# Patient Record
Sex: Male | Born: 1965
Health system: Southern US, Community
[De-identification: ages and names within clinical notes are randomized; demographics above are authoritative.]

## PROBLEM LIST (undated history)

## (undated) DIAGNOSIS — F32A Depression, unspecified: Secondary | ICD-10-CM

## (undated) DIAGNOSIS — I10 Essential (primary) hypertension: Secondary | ICD-10-CM

## (undated) DIAGNOSIS — F419 Anxiety disorder, unspecified: Secondary | ICD-10-CM

## (undated) DIAGNOSIS — G4730 Sleep apnea, unspecified: Secondary | ICD-10-CM

## (undated) DIAGNOSIS — E785 Hyperlipidemia, unspecified: Secondary | ICD-10-CM

## (undated) HISTORY — PX: COLONOSCOPY: SHX174

## (undated) HISTORY — PX: NO PAST SURGERIES: SHX2092

## (undated) HISTORY — DX: Sleep apnea, unspecified: G47.30

## (undated) HISTORY — DX: Hyperlipidemia, unspecified: E78.5

---

## 2002-10-09 ENCOUNTER — Encounter: Payer: Self-pay | Admitting: Emergency Medicine

## 2002-10-09 ENCOUNTER — Emergency Department (HOSPITAL_COMMUNITY): Admission: EM | Admit: 2002-10-09 | Discharge: 2002-10-09 | Payer: Self-pay | Admitting: Emergency Medicine

## 2012-02-06 ENCOUNTER — Ambulatory Visit (HOSPITAL_COMMUNITY)
Admission: RE | Admit: 2012-02-06 | Discharge: 2012-02-06 | Disposition: A | Discharge status: Home / Self Care | Payer: BC Managed Care – PPO | Source: Ambulatory Visit | Attending: Obstetrics | Admitting: Obstetrics

## 2012-02-06 DIAGNOSIS — Z31448 Encounter for other genetic testing of male for procreative management: Secondary | ICD-10-CM | POA: Insufficient documentation

## 2012-02-06 LAB — CBC
HCT: 42.4 % (ref 39.0–52.0)
Hemoglobin: 14.3 g/dL (ref 13.0–17.0)
MCH: 27.6 pg (ref 26.0–34.0)
MCHC: 33.7 g/dL (ref 30.0–36.0)
MCV: 81.7 fL (ref 78.0–100.0)
Platelets: 255 K/uL (ref 150–400)
RBC: 5.19 MIL/uL (ref 4.22–5.81)
RDW: 13 % (ref 11.5–15.5)
WBC: 7.2 K/uL (ref 4.0–10.5)

## 2012-02-06 LAB — FERRITIN: Ferritin: 122 ng/mL (ref 22–322)

## 2012-02-12 LAB — HEMOGLOBINOPATHY EVALUATION
Hemoglobin Other: 0 %
Hgb A2 Quant: 2.7 % (ref 2.2–3.2)
Hgb A: 97.3 % (ref 96.8–97.8)
Hgb F Quant: 0 % (ref 0.0–2.0)
Hgb S Quant: 0 %

## 2012-02-20 ENCOUNTER — Telehealth (HOSPITAL_COMMUNITY): Payer: Self-pay | Admitting: MS"

## 2012-02-20 NOTE — Telephone Encounter (Signed)
Left message for patient's wife, Binnie Rail to return call.

## 2012-02-20 NOTE — Telephone Encounter (Signed)
Discussed with Mr. Munyon partner, Ms. Binnie Rail, that his hemoglobin electrophoresis, ferritin, and complete blood count studies were all within normal range, indicating normal hemoglobin (AA). Thus, he does not appear to have a beta globin variant, such as sickle cell trait or beta thalassemia trait.

## 2013-04-22 ENCOUNTER — Emergency Department (HOSPITAL_COMMUNITY): Payer: Self-pay

## 2013-04-22 ENCOUNTER — Encounter (HOSPITAL_COMMUNITY): Payer: Self-pay | Admitting: Adult Health

## 2013-04-22 DIAGNOSIS — K1120 Sialoadenitis, unspecified: Secondary | ICD-10-CM | POA: Insufficient documentation

## 2013-04-22 DIAGNOSIS — I10 Essential (primary) hypertension: Secondary | ICD-10-CM | POA: Insufficient documentation

## 2013-04-22 DIAGNOSIS — R51 Headache: Secondary | ICD-10-CM | POA: Insufficient documentation

## 2013-04-22 LAB — COMPREHENSIVE METABOLIC PANEL
ALT: 32 U/L (ref 0–53)
AST: 42 U/L — ABNORMAL HIGH (ref 0–37)
Alkaline Phosphatase: 82 U/L (ref 39–117)
CO2: 30 mEq/L (ref 19–32)
Calcium: 9.6 mg/dL (ref 8.4–10.5)
Chloride: 98 mEq/L (ref 96–112)
GFR calc Af Amer: >90 mL/min (ref >90)
GFR calc non Af Amer: 81 mL/min — ABNORMAL LOW (ref >90)
Glucose, Bld: 98 mg/dL (ref 70–99)
Potassium: 2.8 mEq/L — ABNORMAL LOW (ref 3.5–5.1)
Sodium: 137 mEq/L (ref 135–145)
Total Bilirubin: 0.5 mg/dL (ref 0.3–1.2)

## 2013-04-22 MED ORDER — OXYCODONE-ACETAMINOPHEN 5-325 MG PO TABS
1.0000 | ORAL_TABLET | Freq: Once | ORAL | Status: AC
  Administered 2013-04-22: 1 via ORAL
  Filled 2013-04-22: qty 1

## 2013-04-22 NOTE — ED Notes (Addendum)
Presents with right lower tooth pain that began one week ago, worse with eating and drinking. Pt is hypertensive, c/o headache that began at 20:45. Denies dizziness and chest pain.

## 2013-04-22 NOTE — ED Notes (Signed)
NURSE FIRST ROUNDS :  NURSE EXPLAINED DELAY AND PROCESS , UPDATED ON WAIT TIME , DENEIS PAIN AT THIS TIME , RESPIRATIONS UNLABORED , SITTING AT WAITING AREA WITH NO DISTRESS.

## 2013-04-23 ENCOUNTER — Emergency Department (HOSPITAL_COMMUNITY)
Admission: EM | Admit: 2013-04-23 | Discharge: 2013-04-23 | Disposition: A | Discharge status: Home / Self Care | Payer: Self-pay | Attending: Emergency Medicine | Admitting: Emergency Medicine

## 2013-04-23 DIAGNOSIS — I10 Essential (primary) hypertension: Secondary | ICD-10-CM

## 2013-04-23 DIAGNOSIS — K1120 Sialoadenitis, unspecified: Secondary | ICD-10-CM

## 2013-04-23 LAB — CBC WITH DIFFERENTIAL/PLATELET
Basophils Relative: 1 % (ref 0–1)
Eosinophils Relative: 1 % (ref 0–5)
Hemoglobin: 13.6 g/dL (ref 13.0–17.0)
MCH: 28.5 pg (ref 26.0–34.0)
Monocytes Absolute: 0.7 10*3/uL (ref 0.1–1.0)
Monocytes Relative: 7 % (ref 3–12)
Neutrophils Relative %: 53 % (ref 43–77)
RBC: 4.77 MIL/uL (ref 4.22–5.81)
WBC: 10.5 10*3/uL (ref 4.0–10.5)

## 2013-04-23 MED ORDER — POTASSIUM CHLORIDE CRYS ER 20 MEQ PO TBCR
40.0000 meq | EXTENDED_RELEASE_TABLET | Freq: Once | ORAL | Status: AC
  Administered 2013-04-23: 40 meq via ORAL
  Filled 2013-04-23: qty 2

## 2013-04-23 MED ORDER — OXYCODONE-ACETAMINOPHEN 5-325 MG PO TABS: 1.0000 | ORAL_TABLET | ORAL | Status: DC | PRN

## 2013-04-23 MED ORDER — IBUPROFEN 800 MG PO TABS
800.0000 mg | ORAL_TABLET | Freq: Once | ORAL | Status: AC
  Administered 2013-04-23: 800 mg via ORAL
  Filled 2013-04-23: qty 1

## 2013-04-23 MED ORDER — AMOXICILLIN-POT CLAVULANATE 875-125 MG PO TABS: 1.0000 | ORAL_TABLET | Freq: Two times a day (BID) | ORAL | Status: DC

## 2013-04-23 MED ORDER — HYDROCHLOROTHIAZIDE 25 MG PO TABS: 25.0000 mg | ORAL_TABLET | Freq: Every day | ORAL | Status: DC

## 2013-04-23 NOTE — ED Provider Notes (Signed)
History     CSN: [id number]  Arrival date & time 04/22/13  2043   First MD Initiated Contact with Patient 04/23/13 0111      Chief Complaint  Patient presents with  . Dental Pain  . Hypertension    (Consider location/radiation/quality/duration/timing/severity/associated sxs/prior treatment) HPI Comments: Eric Obrien is a 47 y.o. male who presents for evaluation of toothache. The toothache started one week ago. He does not have a dentist and has not had recent dental care. He denies fever, chills, nausea, vomiting, weakness, or dizziness. He also has a headache that started tonight. He denies paresthesias, weakness, gait disorder, syncope or near-syncope. The pain is worse with palpation of the right cheek. No other known modifying factors.  Patient is a 47 y.o. male presenting with tooth pain and hypertension. The history is provided by the patient.  Dental Pain Hypertension    History reviewed. No pertinent past medical history.  History reviewed. No pertinent past surgical history.  History reviewed. No pertinent family history.  History  Substance Use Topics  . Smoking status: Never Smoker   . Smokeless tobacco: Not on file  . Alcohol Use: No      Review of Systems  All other systems reviewed and are negative.    Allergies  Review of patient's allergies indicates no known allergies.  Home Medications   Current Outpatient Rx  Name  Route  Sig  Dispense  Refill  . acetaminophen (TYLENOL) 325 MG tablet   Oral   Take 650 mg by mouth every 6 (six) hours as needed for pain.         Marland Kitchen amoxicillin-clavulanate (AUGMENTIN) 875-125 MG per tablet   Oral   Take 1 tablet by mouth 2 (two) times daily. One po bid x 7 days   14 tablet   0   . hydrochlorothiazide (HYDRODIURIL) 25 MG tablet   Oral   Take 1 tablet (25 mg total) by mouth daily.   30 tablet   0   . oxyCODONE-acetaminophen (PERCOCET/ROXICET) 5-325 MG per tablet   Oral   Take 1 tablet by mouth every  4 (four) hours as needed for pain.   15 tablet   0     BP 182/124  Pulse 80  Temp(Src) 98.9 F (37.2 C) (Oral)  Resp 18  Ht 5\' 2"  (1.575 m)  Wt 172 lb 14.4 oz (78.427 kg)  BMI 31.62 kg/m2  SpO2 95%  Physical Exam  Nursing note and vitals reviewed. Constitutional: He is oriented to person, place, and time. He appears well-developed and well-nourished.  HENT:  Head: Normocephalic and atraumatic.  Right Ear: External ear normal.  Left Ear: External ear normal.  Tender right angle of jaw, to palpation; with associated induration and mild swelling of the right submandibular salivary gland; is no overlying erythema. No palpable salivary duct stone.  Eyes: Conjunctivae and EOM are normal. Pupils are equal, round, and reactive to light.  Neck: Normal range of motion and phonation normal. Neck supple. No tracheal deviation present. No thyromegaly present.  Cardiovascular: Normal rate, regular rhythm, normal heart sounds and intact distal pulses.   Pulmonary/Chest: Effort normal and breath sounds normal. He exhibits no bony tenderness.  Abdominal: Soft. Normal appearance. There is no tenderness.  Musculoskeletal: Normal range of motion.  Lymphadenopathy:    He has no cervical adenopathy.  Neurological: He is alert and oriented to person, place, and time. He has normal strength. No cranial nerve deficit or sensory deficit. He exhibits normal muscle  tone. Coordination normal.  Skin: Skin is warm, dry and intact.  Psychiatric: He has a normal mood and affect. His behavior is normal. Judgment and thought content normal.    ED Course  Procedures (including critical care time) Medications  oxyCODONE-acetaminophen (PERCOCET/ROXICET) 5-325 MG per tablet 1 tablet (1 tablet Oral Given 04/22/13 2119)  potassium chloride SA (K-DUR,KLOR-CON) CR tablet 40 mEq (40 mEq Oral Given 04/23/13 0124)  ibuprofen (ADVIL,MOTRIN) tablet 800 mg (800 mg Oral Given 04/23/13 0124)    Patient Vitals for the past 24  hrs:  BP Temp Temp src Pulse Resp SpO2 Height Weight  04/23/13 0200 182/124 mmHg - - 80 18 95 % - -  04/22/13 2305 171/120 mmHg 98.9 F (37.2 C) Oral 78 14 100 % - -  04/22/13 2227 169/116 mmHg 99.2 F (37.3 C) Oral 82 16 96 % - -  04/22/13 2048 190/126 mmHg 98.3 F (36.8 C) Oral 90 16 97 % 5\' 2"  (1.575 m) 172 lb 14.4 oz (78.427 kg)       Labs Reviewed  COMPREHENSIVE METABOLIC PANEL - Abnormal; Notable for the following:    Potassium 2.8 (*)    AST 42 (*)    GFR calc non Af Amer 81 (*)    All other components within normal limits  CBC WITH DIFFERENTIAL   Dg Chest 2 View  04/22/2013   *RADIOLOGY REPORT*  Clinical Data: Dental pain; hypertension.  Recent cough.  CHEST - 2 VIEW  Comparison: None.  Findings: The lungs are well-aerated.  Mild peribronchial thickening is noted.  There is no evidence of focal opacification, pleural effusion or pneumothorax.  The heart is normal in size; the mediastinal contour is within normal limits.  No acute osseous abnormalities are seen.  IMPRESSION: Mild peribronchial thickening noted; no acute cardiopulmonary process seen.   Original Report Authenticated By: Tonia Ghent, M.D.     1. Salivary gland infection   2. Hypertension       MDM  Salivary gland infection. No palpable salivary duct stone. Incidental hypertension. No evidence for hypertensive urgency, or and organ damage. Doubt metabolic instability, serious bacterial infection or impending vascular collapse; the patient is stable for discharge.  Nursing Notes Reviewed/ Care Coordinated, and agree without changes. Applicable Imaging Reviewed.  Interpretation of Laboratory Data incorporated into ED treatment   Plan: Home Medications- HCTZ, Augmentin, Percocet; Home Treatments- warm compress to right salivary gland, 3 times a day; Recommended follow up- ENT followup one week. PCP followup, one or 2 weeks, for ongoing management of hypertension.        Flint Melter, MD 04/23/13  [phone]

## 2013-10-15 ENCOUNTER — Emergency Department (HOSPITAL_COMMUNITY)
Admission: EM | Admit: 2013-10-15 | Discharge: 2013-10-15 | Disposition: A | Discharge status: Home / Self Care | Payer: No Typology Code available for payment source | Attending: Emergency Medicine | Admitting: Emergency Medicine

## 2013-10-15 ENCOUNTER — Emergency Department (HOSPITAL_COMMUNITY): Payer: No Typology Code available for payment source

## 2013-10-15 ENCOUNTER — Encounter (HOSPITAL_COMMUNITY): Payer: Self-pay | Admitting: Emergency Medicine

## 2013-10-15 DIAGNOSIS — R209 Unspecified disturbances of skin sensation: Secondary | ICD-10-CM | POA: Insufficient documentation

## 2013-10-15 DIAGNOSIS — S239XXA Sprain of unspecified parts of thorax, initial encounter: Secondary | ICD-10-CM | POA: Insufficient documentation

## 2013-10-15 DIAGNOSIS — R404 Transient alteration of awareness: Secondary | ICD-10-CM | POA: Insufficient documentation

## 2013-10-15 DIAGNOSIS — Y92410 Unspecified street and highway as the place of occurrence of the external cause: Secondary | ICD-10-CM | POA: Insufficient documentation

## 2013-10-15 DIAGNOSIS — Z79899 Other long term (current) drug therapy: Secondary | ICD-10-CM | POA: Insufficient documentation

## 2013-10-15 DIAGNOSIS — S0990XA Unspecified injury of head, initial encounter: Secondary | ICD-10-CM | POA: Insufficient documentation

## 2013-10-15 DIAGNOSIS — I10 Essential (primary) hypertension: Secondary | ICD-10-CM | POA: Insufficient documentation

## 2013-10-15 DIAGNOSIS — Y9389 Activity, other specified: Secondary | ICD-10-CM | POA: Insufficient documentation

## 2013-10-15 DIAGNOSIS — S29012A Strain of muscle and tendon of back wall of thorax, initial encounter: Secondary | ICD-10-CM

## 2013-10-15 DIAGNOSIS — M542 Cervicalgia: Secondary | ICD-10-CM | POA: Insufficient documentation

## 2013-10-15 HISTORY — DX: Essential (primary) hypertension: I10

## 2013-10-15 MED ORDER — IBUPROFEN 800 MG PO TABS: 800.0000 mg | ORAL_TABLET | Freq: Three times a day (TID) | ORAL | Status: DC

## 2013-10-15 MED ORDER — METHOCARBAMOL 500 MG PO TABS: 500.0000 mg | ORAL_TABLET | Freq: Two times a day (BID) | ORAL | Status: DC

## 2013-10-15 NOTE — Progress Notes (Signed)
Patient confirms his pcp is Dr. Melanee Left.  EDCM unable to locate this physician.
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
## 2013-10-18 NOTE — ED Provider Notes (Signed)
Medical screening examination/treatment/procedure(s) were performed by non-physician practitioner and as supervising physician I was immediately available for consultation/collaboration.   Gilda Crease, MD 10/18/13 [phone]

## 2015-02-07 ENCOUNTER — Ambulatory Visit: Payer: 59

## 2015-06-15 ENCOUNTER — Ambulatory Visit (INDEPENDENT_AMBULATORY_CARE_PROVIDER_SITE_OTHER): Payer: 59 | Admitting: Family Medicine

## 2015-06-15 VITALS — BP 152/92 | HR 82 | Temp 98.4°F | Resp 16 | Ht 64.0 in | Wt 199.0 lb

## 2015-06-15 DIAGNOSIS — B353 Tinea pedis: Secondary | ICD-10-CM | POA: Diagnosis not present

## 2015-06-15 DIAGNOSIS — R451 Restlessness and agitation: Secondary | ICD-10-CM

## 2015-06-15 DIAGNOSIS — I10 Essential (primary) hypertension: Secondary | ICD-10-CM | POA: Diagnosis not present

## 2015-06-15 DIAGNOSIS — L309 Dermatitis, unspecified: Secondary | ICD-10-CM | POA: Diagnosis not present

## 2015-06-15 DIAGNOSIS — L290 Pruritus ani: Secondary | ICD-10-CM

## 2015-06-15 LAB — LIPID PANEL
CHOL/HDL RATIO: 6.4 ratio
Cholesterol: 243 mg/dL — ABNORMAL HIGH (ref 0–200)
HDL: 38 mg/dL — AB (ref >=40)
LDL CALC: 165 mg/dL — AB (ref 0–99)
TRIGLYCERIDES: 198 mg/dL — AB (ref <150)
VLDL: 40 mg/dL (ref 0–40)

## 2015-06-15 LAB — COMPLETE METABOLIC PANEL WITH GFR
ALK PHOS: 68 U/L (ref 39–117)
ALT: 30 U/L (ref 0–53)
AST: 32 U/L (ref 0–37)
Albumin: 4.1 g/dL (ref 3.5–5.2)
BILIRUBIN TOTAL: 0.5 mg/dL (ref 0.2–1.2)
BUN: 12 mg/dL (ref 6–23)
CHLORIDE: 99 meq/L (ref 96–112)
CO2: 29 meq/L (ref 19–32)
CREATININE: 1.03 mg/dL (ref 0.50–1.35)
Calcium: 9.6 mg/dL (ref 8.4–10.5)
GFR, Est African American: >89 mL/min
GFR, Est Non African American: 85 mL/min
Glucose, Bld: 88 mg/dL (ref 70–99)
POTASSIUM: 3.7 meq/L (ref 3.5–5.3)
SODIUM: 138 meq/L (ref 135–145)
Total Protein: 7.6 g/dL (ref 6.0–8.3)

## 2015-06-15 LAB — POCT SKIN KOH: SKIN KOH, POC: POSITIVE

## 2015-06-15 MED ORDER — HYDROCORTISONE ACE-PRAMOXINE 2.5-1 % RE CREA: 1.0000 "application " | TOPICAL_CREAM | Freq: Three times a day (TID) | RECTAL | Status: DC

## 2015-06-15 MED ORDER — TERBINAFINE HCL 250 MG PO TABS: 250.0000 mg | ORAL_TABLET | Freq: Every day | ORAL | Status: DC

## 2015-06-15 MED ORDER — AMLODIPINE BESYLATE 5 MG PO TABS: 5.0000 mg | ORAL_TABLET | Freq: Every day | ORAL | Status: DC

## 2015-06-15 NOTE — Progress Notes (Signed)
  Subjective:  Patient ID: Eric Obrien, male    DOB: [birth year]  Age: 49 y.o. MRN: [id number]  49 year old man here for his first time. He is from [place]. He has several complaints. He has a history of high blood pressure, has not been taking his medicine and has been out of it for a number of months. He says he has restlessness a lot. 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 We talked about the need for more regular sleep. He has a rash between his fourth and fifth toes of his left foot. He pill something off of there. He has been having a very itchy anus and last week.   Objective:   No thyromegaly. Chest clear. Heart regular without murmurs. No edema. Dermatitis between fourth and fifth toes of the left foot, looks like a athlete's foot. I don't see anything that would've peeled off but it may have just been some crusting. The anal area looks a little irritated. No hemorrhoids.  Assessment & Plan:   Assessment: Pruritus ani Hypertension Restlessness Interdigital rash on foot   Plan: Skin scraping, see met, lipids  Results for orders placed or performed in visit on 06/15/15  POCT Skin KOH  Result Value Ref Range   Skin KOH, POC Positive     Patient Instructions  Take terbinafine 250 mg one daily for 2 weeks for infection of foot  Also get some over-the-counter Lotrimin (clotrimazole) cream and rub it between the toes twice daily  Use the Analpram cream around your anus (butt hole) twice daily as needed for itching  Take amlodipine 1 daily for your blood pressure  I will let you know the results of your labs  Return in about 3-4 months for a recheck on the blood pressure, sooner if problems     Phillp Dolores, MD 06/15/2015

## 2015-06-15 NOTE — Patient Instructions (Signed)
Take terbinafine 250 mg one daily for 2 weeks for infection of foot  Also get some over-the-counter Lotrimin (clotrimazole) cream and rub it between the toes twice daily  Use the Analpram cream around your anus (butt hole) twice daily as needed for itching  Take amlodipine 1 daily for your blood pressure  I will let you know the results of your labs  Return in about 3-4 months for a recheck on the blood pressure, sooner if problems

## 2015-06-20 ENCOUNTER — Telehealth: Payer: Self-pay

## 2015-06-20 ENCOUNTER — Telehealth: Payer: Self-pay | Admitting: Family Medicine

## 2015-06-20 DIAGNOSIS — E7800 Pure hypercholesterolemia, unspecified: Secondary | ICD-10-CM

## 2015-06-20 MED ORDER — PRAVASTATIN SODIUM 40 MG PO TABS: 40.0000 mg | ORAL_TABLET | Freq: Every day | ORAL | Status: DC

## 2015-06-20 NOTE — Telephone Encounter (Signed)
ERROR

## 2015-06-20 NOTE — Telephone Encounter (Signed)
Pt missed his CB from the lab. Please advise at [phone]

## 2015-06-21 NOTE — Telephone Encounter (Signed)
Attempted to call pt back. No answer; left VM.
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
## 2015-06-26 NOTE — Telephone Encounter (Signed)
lmom to call lab back

## 2015-06-27 ENCOUNTER — Encounter: Payer: Self-pay | Admitting: Family Medicine

## 2015-06-27 NOTE — Telephone Encounter (Signed)
Patient wants lab results mailed to him

## 2015-06-28 NOTE — Telephone Encounter (Signed)
I have printed results and put in envelope to mail.

## 2015-07-26 ENCOUNTER — Other Ambulatory Visit: Payer: Self-pay | Admitting: Family Medicine

## 2015-10-07 ENCOUNTER — Ambulatory Visit (INDEPENDENT_AMBULATORY_CARE_PROVIDER_SITE_OTHER): Payer: 59 | Admitting: Family Medicine

## 2015-10-07 ENCOUNTER — Ambulatory Visit (INDEPENDENT_AMBULATORY_CARE_PROVIDER_SITE_OTHER): Payer: 59

## 2015-10-07 VITALS — BP 140/98 | HR 70 | Temp 98.5°F | Resp 18 | Ht 64.0 in | Wt 200.0 lb

## 2015-10-07 DIAGNOSIS — L290 Pruritus ani: Secondary | ICD-10-CM | POA: Diagnosis not present

## 2015-10-07 DIAGNOSIS — I10 Essential (primary) hypertension: Secondary | ICD-10-CM

## 2015-10-07 DIAGNOSIS — R0789 Other chest pain: Secondary | ICD-10-CM

## 2015-10-07 DIAGNOSIS — R0602 Shortness of breath: Secondary | ICD-10-CM

## 2015-10-07 DIAGNOSIS — R079 Chest pain, unspecified: Secondary | ICD-10-CM

## 2015-10-07 DIAGNOSIS — R635 Abnormal weight gain: Secondary | ICD-10-CM | POA: Diagnosis not present

## 2015-10-07 DIAGNOSIS — E785 Hyperlipidemia, unspecified: Secondary | ICD-10-CM

## 2015-10-07 DIAGNOSIS — E7800 Pure hypercholesterolemia, unspecified: Secondary | ICD-10-CM | POA: Diagnosis not present

## 2015-10-07 LAB — POCT CBC
GRANULOCYTE PERCENT: 40.2 % (ref 37–80)
HEMATOCRIT: 42.4 % — AB (ref 43.5–53.7)
HEMOGLOBIN: 14.1 g/dL (ref 14.1–18.1)
Lymph, poc: 4.5 — AB (ref 0.6–3.4)
MCH, POC: 27.8 pg (ref 27–31.2)
MCHC: 33.3 g/dL (ref 31.8–35.4)
MCV: 83.5 fL (ref 80–97)
MID (cbc): 0.6 (ref 0–0.9)
MPV: 8.2 fL (ref 0–99.8)
PLATELET COUNT, POC: 238 10*3/uL (ref 142–424)
POC GRANULOCYTE: 3.4 (ref 2–6.9)
POC LYMPH PERCENT: 53.2 %L — AB (ref 10–50)
POC MID %: 6.6 %M (ref 0–12)
RBC: 5.07 M/uL (ref 4.69–6.13)
RDW, POC: 12.7 %
WBC: 8.5 10*3/uL (ref 4.6–10.2)

## 2015-10-07 LAB — GLUCOSE, POCT (MANUAL RESULT ENTRY): POC GLUCOSE: 78 mg/dL (ref 70–99)

## 2015-10-07 MED ORDER — HYDROCORTISONE ACE-PRAMOXINE 2.5-1 % RE CREA: TOPICAL_CREAM | RECTAL | Status: DC

## 2015-10-07 MED ORDER — AMLODIPINE BESYLATE 10 MG PO TABS: 10.0000 mg | ORAL_TABLET | Freq: Every day | ORAL | Status: DC

## 2015-10-07 MED ORDER — PRAVASTATIN SODIUM 40 MG PO TABS: 40.0000 mg | ORAL_TABLET | Freq: Every day | ORAL | Status: DC

## 2015-10-07 MED ORDER — HYDROCHLOROTHIAZIDE 25 MG PO TABS: ORAL_TABLET | ORAL | Status: DC
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
## 2015-10-07 NOTE — Patient Instructions (Signed)
Continue previous medications  Increase amlodipine to 10 mg daily. You can double up on the existing prescription of 5 mg pills until you get the new prescription if you wish.  Referral is being made to cardiology. If you do not hear from our office regarding that by Tuesday or Wednesday please call and speak to our referrals desk.  In the event of further chest pain go to the emergency room  Avoid exercising vigorously for the next few days, to let the chest wall pain subsided, then you can resume gradually your regular exercise regimen.  Return in 3-4 weeks to follow-up on your blood pressure  Eat less as discussed to try and start losing some weight.  DASH Eating Plan DASH stands for "Dietary Approaches to Stop Hypertension." The DASH eating plan is a healthy eating plan that has been shown to reduce high blood pressure (hypertension). Additional health benefits may include reducing the risk of type 2 diabetes mellitus, heart disease, and stroke. The DASH eating plan may also help with weight loss. WHAT DO I NEED TO KNOW ABOUT THE DASH EATING PLAN? For the DASH eating plan, you will follow these general guidelines:  Choose foods with a percent daily value for sodium of less than 5% (as listed on the food label).  Use salt-free seasonings or herbs instead of table salt or sea salt.  Check with your health care provider or pharmacist before using salt substitutes.  Eat lower-sodium products, often labeled as "lower sodium" or "no salt added."  Eat fresh foods.  Eat more vegetables, fruits, and low-fat dairy products.  Choose whole grains. Look for the word "whole" as the first word in the ingredient list.  Choose fish and skinless chicken or [place] more often than red meat. Limit fish, poultry, and meat to 6 oz (170 g) each day.  Limit sweets, desserts, sugars, and sugary drinks.  Choose heart-healthy fats.  Limit cheese to 1 oz (28 g) per day.  Eat more home-cooked food and  less restaurant, buffet, and fast food.  Limit fried foods.  Cook foods using methods other than frying.  Limit canned vegetables. If you do use them, rinse them well to decrease the sodium.  When eating at a restaurant, ask that your food be prepared with less salt, or no salt if possible. WHAT FOODS CAN I EAT? Seek help from a dietitian for individual calorie needs. Grains Whole grain or whole wheat bread. Brown rice. Whole grain or whole wheat pasta. Quinoa, bulgur, and whole grain cereals. Low-sodium cereals. Corn or whole wheat flour tortillas. Whole grain cornbread. Whole grain crackers. Low-sodium crackers. Vegetables Fresh or frozen vegetables (raw, steamed, roasted, or grilled). Low-sodium or reduced-sodium tomato and vegetable juices. Low-sodium or reduced-sodium tomato sauce and paste. Low-sodium or reduced-sodium canned vegetables.  Fruits All fresh, canned (in natural juice), or frozen fruits. Meat and Other Protein Products Ground beef (85% or leaner), grass-fed beef, or beef trimmed of fat. Skinless chicken or [place]. Ground chicken or [place]. Pork trimmed of fat. All fish and seafood. Eggs. Dried beans, peas, or lentils. Unsalted nuts and seeds. Unsalted canned beans. Dairy Low-fat dairy products, such as skim or 1% milk, 2% or reduced-fat cheeses, low-fat ricotta or cottage cheese, or plain low-fat yogurt. Low-sodium or reduced-sodium cheeses. Fats and Oils Tub margarines without trans fats. Light or reduced-fat mayonnaise and salad dressings (reduced sodium). Avocado. Safflower, olive, or canola oils. Natural peanut or almond butter. Other Unsalted popcorn and pretzels. The items listed above may  not be a complete list of recommended foods or beverages. Contact your dietitian for more options. WHAT FOODS ARE NOT RECOMMENDED? Grains White bread. White pasta. White rice. Refined cornbread. Bagels and croissants. Crackers that contain trans fat. Vegetables Creamed or  fried vegetables. Vegetables in a cheese sauce. Regular canned vegetables. Regular canned tomato sauce and paste. Regular tomato and vegetable juices. Fruits Dried fruits. Canned fruit in light or heavy syrup. Fruit juice. Meat and Other Protein Products Fatty cuts of meat. Ribs, chicken wings, bacon, sausage, bologna, salami, chitterlings, fatback, hot dogs, bratwurst, and packaged luncheon meats. Salted nuts and seeds. Canned beans with salt. Dairy Whole or 2% milk, cream, half-and-half, and cream cheese. Whole-fat or sweetened yogurt. Full-fat cheeses or blue cheese. Nondairy creamers and whipped toppings. Processed cheese, cheese spreads, or cheese curds. Condiments Onion and garlic salt, seasoned salt, table salt, and sea salt. Canned and packaged gravies. Worcestershire sauce. Tartar sauce. Barbecue sauce. Teriyaki sauce. Soy sauce, including reduced sodium. Steak sauce. Fish sauce. Oyster sauce. Cocktail sauce. Horseradish. Ketchup and mustard. Meat flavorings and tenderizers. Bouillon cubes. Hot sauce. Tabasco sauce. Marinades. Taco seasonings. Relishes. Fats and Oils Butter, stick margarine, lard, shortening, ghee, and bacon fat. Coconut, palm kernel, or palm oils. Regular salad dressings. Other Pickles and olives. Salted popcorn and pretzels. The items listed above may not be a complete list of foods and beverages to avoid. Contact your dietitian for more information. WHERE CAN I FIND MORE INFORMATION? National Heart, Lung, and Blood Institute: travelstabloid.com   This information is not intended to replace advice given to you by your health care provider. Make sure you discuss any questions you have with your health care provider.   Document Released: 11/02/2011 Document Revised: 12/04/2014 Document Reviewed: 09/17/2013 Elsevier Interactive Patient Education [employer].

## 2015-10-08 LAB — LIPID PANEL
CHOL/HDL RATIO: 6.1 ratio — AB (ref <=5.0)
Cholesterol: 224 mg/dL — ABNORMAL HIGH (ref 125–200)
HDL: 37 mg/dL — AB (ref >=40)
LDL Cholesterol: 145 mg/dL — ABNORMAL HIGH (ref <130)
TRIGLYCERIDES: 211 mg/dL — AB (ref <150)
VLDL: 42 mg/dL — ABNORMAL HIGH (ref <30)

## 2015-10-08 LAB — COMPREHENSIVE METABOLIC PANEL
ALK PHOS: 73 U/L (ref 40–115)
ALT: 27 U/L (ref 9–46)
AST: 28 U/L (ref 10–40)
Albumin: 4.3 g/dL (ref 3.6–5.1)
BILIRUBIN TOTAL: 0.6 mg/dL (ref 0.2–1.2)
BUN: 14 mg/dL (ref 7–25)
CALCIUM: 9.5 mg/dL (ref 8.6–10.3)
CO2: 26 mmol/L (ref 20–31)
Chloride: 100 mmol/L (ref 98–110)
Creat: 1.14 mg/dL (ref 0.60–1.35)
GLUCOSE: 85 mg/dL (ref 65–99)
POTASSIUM: 3.7 mmol/L (ref 3.5–5.3)
Sodium: 138 mmol/L (ref 135–146)
TOTAL PROTEIN: 7.3 g/dL (ref 6.1–8.1)

## 2015-10-22 ENCOUNTER — Other Ambulatory Visit: Payer: Self-pay | Admitting: *Deleted

## 2015-10-22 MED ORDER — PRAVASTATIN SODIUM 80 MG PO TABS: 80.0000 mg | ORAL_TABLET | Freq: Every day | ORAL | Status: DC

## 2015-11-04 ENCOUNTER — Encounter: Payer: Self-pay | Admitting: Cardiology

## 2015-11-04 DIAGNOSIS — I119 Hypertensive heart disease without heart failure: Secondary | ICD-10-CM | POA: Insufficient documentation

## 2015-11-04 DIAGNOSIS — E785 Hyperlipidemia, unspecified: Secondary | ICD-10-CM | POA: Insufficient documentation

## 2015-11-04 DIAGNOSIS — E669 Obesity, unspecified: Secondary | ICD-10-CM | POA: Insufficient documentation

## 2015-11-04 DIAGNOSIS — I10 Essential (primary) hypertension: Secondary | ICD-10-CM

## 2015-11-04 NOTE — Progress Notes (Signed)
Patient ID: Eric Obrien, male   DOB: 02-Jul-1966, 49 y.o.   MRN: [id number]   Eric Obrien    Date of visit:  11/04/2015 DOB:  01/31/1966    Age:  64 yrs. Medical record number:  [id number]     Account number:  [id number] Primary Care Provider: HOPPER,DAVID ____________________________ CURRENT DIAGNOSES  1. Chest pain  2. Hyperlipidemia  3. Essential hypertension  4. Obesity ____________________________ ALLERGIES  No Known Allergies ____________________________ MEDICATIONS  1. amlodipine 10 mg tablet, 1 p.o. daily  2. hydrochlorothiazide 25 mg tablet, 1 p.o. daily  3. pravastatin 40 mg tablet, 1 p.o. daily  4. Analpram-HC 2.5 %-1 % rectal cream, Take as directed  5. methocarbamol 500 mg tablet, BID  6. multivitamin tablet, 1 p.o. daily  7. Lamisil 250 mg tablet, 1 p.o. daily  8. ibuprofen 800 mg tablet, PRN ____________________________ CHIEF COMPLAINTS  Chest pain  worse with chest movement on the way home from gym ____________________________ HISTORY OF PRESENT ILLNESS This very nice 49 year old African American male is seen for evaluation of chest pain. The patient has a history of hypertension, obesity and hyperlipidemia. He previously was able to workout on a regular basis without symptoms. He presented to his primary doctor on 10 November with chest tightness and shortness of breath that lasted around 3 minutes. He was described as a throbbing pain in his substernal area and is not associated with sweating or shortness of breath. It may have been mildly pleuritic. He was driving home after having exercise and had to pull over for a while and then saw Dr. Jodi Mourning. He has not had any recurrent pain since that period he denies PND, orthopnea, syncope, palpitations or claudication. Blood pressure was somewhat elevated and his amlodipine was increased. The chest wall pain was treated with ibuprofen and has improved. I was asked to see him for evaluation. ____________________________ PAST  HISTORY  Past Medical Illnesses:  hypertension, hyperlipidemia, obesity;  Cardiovascular Illnesses:  no previous history of cardiac disease.;  Surgical Procedures:  no prior surgical procedures;  NYHA Classification:  I;  Canadian Angina Classification:  Class 0: Asymptomatic;  Cardiology Procedures-Invasive:  no history of prior cardiac procedures;  Cardiology Procedures-Noninvasive:  no previous non-invasive procedures;  LVEF not documented,   ____________________________ CARDIO-PULMONARY TEST DATES EKG Date:  11/04/2015;   ____________________________ FAMILY HISTORY Brother -- Brother alive and well Brother -- Brother alive and well Father -- Father dead, Alzheimers disease Mother -- Mother alive and well Sister -- Sister alive and well Sister -- Sister alive and well ____________________________ SOCIAL HISTORY Alcohol Use:  occasionally;  Smoking:  never smoked;  Diet:  regular diet;  Lifestyle:  married;  Exercise:  gym;  Occupation:  Orthoptist;  Residence:  lives with wife and children;   ____________________________ REVIEW OF SYSTEMS General:  obesity  Integumentary:no rashes or new skin lesions. Eyes: wears eye glasses/contact lenses Ears, Nose, Throat, Mouth:  denies any hearing loss, epistaxis, hoarseness or difficulty speaking. Respiratory: denies dyspnea, cough, wheezing or hemoptysis. Cardiovascular:  please review HPI Abdominal: denies dyspepsia, GI bleeding, constipation, or diarrhea Genitourinary-Male: no dysuria, urgency, frequency, or nocturia  Musculoskeletal:  denies arthritis, venous insufficiency, or muscle weakness Neurological:  denies headaches, stroke, or TIA Hematological/Immunologic:  denies any food allergies, bleeding disorders. ____________________________ PHYSICAL EXAMINATION VITAL SIGNS  Blood Pressure:  144/70 Sitting, Left arm, large cuff  , 128/84 Standing, Left arm and large cuff   Pulse:  84/min. Weight:  200.00 lbs. Height:  64"BMI:  34  Constitutional:  pleasant African Americian male in no acute distress, mildly obese Skin:  warm and dry to touch, no apparent skin lesions, or masses noted. Head:  normocephalic, normal hair pattern, no masses or tenderness Eyes:  EOMS Intact, PERRLA, C and S clear, Funduscopic exam not done. ENT:  ears, nose and throat reveal no gross abnormalities.  Dentition good. Neck:  supple, without massess. No JVD, thyromegaly or carotid bruits. Carotid upstroke normal. Chest:  normal symmetry, clear to auscultation. Cardiac:  regular rhythm, normal S1 and S2, No S3 or S4, no murmurs, gallops or rubs detected. Abdomen:  abdomen soft,non-tender, no masses, no hepatospenomegaly, or aneurysm noted Peripheral Pulses:  the femoral,dorsalis pedis, and posterior tibial pulses are full and equal bilaterally with no bruits auscultated. Extremities & Back:  no deformities, clubbing, cyanosis, erythema or edema observed. Normal muscle strength and tone. Neurological:  no gross motor or sensory deficits noted, affect appropriate, oriented x3. ____________________________ MOST RECENT LIPID PANEL 10/11/15  CHOL TOTL 224 mg/dl, LDL 145 NM, HDL 37 mg/dl, TRIGLYCER 211 mg/dl and CHOL/HDL 6.1 (Calc) ____________________________ IMPRESSIONS/PLAN  1. Isolated episode of chest pain that has now resolved 2. Hypertension borderline control 3. Hyperlipidemia under treatment 4. Obesity with need to lose weight  Recommendations:  Chest pain has some atypical features but has multiple risk factors. He has an abnormal EKG with right axis deviation. There were no ischemic abnormalities noted. I recommended that he have an echocardiogram because of the right axis deviation and also that he have a standard exercise treadmill test. We talked about the importance of weight loss. Thank you asking me to see him with you. ____________________________ TODAYS ORDERS  1. treadmill:  Regular [facility] At [facility]  2. 2D,  color flow, doppler: First Available  3. 12 Lead EKG: Today                  ____________________________ Cardiology Physician:  Kerry Hough MD [facility]

## 2015-11-26 ENCOUNTER — Encounter: Payer: Self-pay | Admitting: Cardiology

## 2015-11-26 ENCOUNTER — Encounter: Payer: Self-pay | Admitting: Family Medicine

## 2015-11-26 NOTE — Progress Notes (Signed)
Patient ID: Eric Obrien, male   DOB: [birth year], 49 y.o.   MRN: [id number]  [name], [name]    Date of visit:  11/26/2015 DOB:  11-26-1966    Age:  64 yrs. Medical record number:  [id number]     Account number:  [id number] Primary Care Provider: [place]  1. Hypertensive heart disease without heart failure  2. Chest pain  3. Hyperlipidemia  4. Obesity  TREADMILL  The patient exercised on the standard Bruce protocol a total of 7:52 minutes into Bruce stage 3 achieving a workload of 9 METS. The test was stopped due to dyspnea and fatigue. The patient had no chest pain suggestive of angina. Heart rate rose to 157 which was 91% of predicted maximum. Blood pressure response was normal.  12-lead EKG shows horizontal axis at rest. With exercise the patient achieved target heart rate and had no ST segment depression consistent with ischemia. No arrhythmias occurred. No arrhythmias were noted.     IMPRESSIONS:  1. Clinically and EKG negative for ischemia 2. Average exercise tolerance for age  RECOMMENDATIONS:  Has a negative adequate exercise tolerance test as no evidence of myocardial ischemia. Echocardiogram shows moderate concentric LVH with normal systolic function. He likely has hypertensive heart disease but has a low risk treadmill test. It is recommended that he exercise regularly, lose weight, and maintain good blood pressure control. I would be happy to see him in the future should the need arise. May be worthwhile to consider statin therapy of hyperlipidemia  MOST RECENT LIPID PANEL 10/11/15  CHOL TOTL 224 mg/dl, LDL 145 NM, HDL 37 mg/dl, TRIGLYCER 211 mg/dl and CHOL/HDL 6.1 (Calc)    Cardiology Physician:  Kerry Hough. MD [facility]

## 2016-04-21 DIAGNOSIS — H11002 Unspecified pterygium of left eye: Secondary | ICD-10-CM | POA: Diagnosis not present

## 2016-04-21 DIAGNOSIS — H04123 Dry eye syndrome of bilateral lacrimal glands: Secondary | ICD-10-CM | POA: Diagnosis not present

## 2016-04-21 DIAGNOSIS — H40013 Open angle with borderline findings, low risk, bilateral: Secondary | ICD-10-CM | POA: Diagnosis not present

## 2016-04-21 DIAGNOSIS — H10413 Chronic giant papillary conjunctivitis, bilateral: Secondary | ICD-10-CM | POA: Diagnosis not present

## 2016-10-09 ENCOUNTER — Other Ambulatory Visit: Payer: Self-pay | Admitting: Family Medicine

## 2016-10-09 DIAGNOSIS — I10 Essential (primary) hypertension: Secondary | ICD-10-CM

## 2016-10-13 ENCOUNTER — Other Ambulatory Visit: Payer: Self-pay | Admitting: Family Medicine

## 2016-10-24 DIAGNOSIS — H40013 Open angle with borderline findings, low risk, bilateral: Secondary | ICD-10-CM | POA: Diagnosis not present

## 2016-12-08 ENCOUNTER — Ambulatory Visit (INDEPENDENT_AMBULATORY_CARE_PROVIDER_SITE_OTHER): Payer: BLUE CROSS/BLUE SHIELD | Admitting: Emergency Medicine

## 2016-12-08 VITALS — BP 110/84 | HR 96 | Temp 98.0°F | Resp 16 | Ht 64.0 in | Wt 199.2 lb

## 2016-12-08 DIAGNOSIS — J209 Acute bronchitis, unspecified: Secondary | ICD-10-CM

## 2016-12-08 DIAGNOSIS — R05 Cough: Secondary | ICD-10-CM | POA: Diagnosis not present

## 2016-12-08 DIAGNOSIS — R059 Cough, unspecified: Secondary | ICD-10-CM

## 2016-12-08 DIAGNOSIS — R5383 Other fatigue: Secondary | ICD-10-CM | POA: Diagnosis not present

## 2016-12-08 MED ORDER — AZITHROMYCIN 250 MG PO TABS: ORAL_TABLET | ORAL | 0 refills | Status: DC

## 2016-12-08 MED ORDER — PROMETHAZINE-CODEINE 6.25-10 MG/5ML PO SYRP: 5.0000 mL | ORAL_SOLUTION | Freq: Four times a day (QID) | ORAL | 0 refills | Status: AC | PRN

## 2016-12-08 NOTE — Patient Instructions (Addendum)
     IF you received an x-ray today, you will receive an invoice from [facility]. Please contact [facility] at [phone] with questions or concerns regarding your invoice.   IF you received labwork today, you will receive an invoice from [facility]. Please contact [facility] at [phone] with questions or concerns regarding your invoice.   Our billing staff will not be able to assist you with questions regarding bills from these companies.  You will be contacted with the lab results as soon as they are available. The fastest way to get your results is to activate your My Chart account. Instructions are located on the last page of this paperwork. If you have not heard from us regarding the results in 2 weeks, please contact this office.      Acute Bronchitis, Adult Acute bronchitis is when air tubes (bronchi) in the lungs suddenly get swollen. The condition can make it hard to breathe. It can also cause these symptoms:  A cough.  Coughing up clear, yellow, or green mucus.  Wheezing.  Chest congestion.  Shortness of breath.  A fever.  Body aches.  Chills.  A sore throat.  Follow these instructions at home: Medicines  Take over-the-counter and prescription medicines only as told by your doctor.  If you were prescribed an antibiotic medicine, take it as told by your doctor. Do not stop taking the antibiotic even if you start to feel better. General instructions  Rest.  Drink enough fluids to keep your pee (urine) clear or pale yellow.  Avoid smoking and secondhand smoke. If you smoke and you need help quitting, ask your doctor. Quitting will help your lungs heal faster.  Use an inhaler, cool mist vaporizer, or humidifier as told by your doctor.  Keep all follow-up visits as told by your doctor. This is important. How is this prevented? To lower your risk of getting this condition again:  Wash your hands often with soap and water. If you cannot  use soap and water, use hand sanitizer.  Avoid contact with people who have cold symptoms.  Try not to touch your hands to your mouth, nose, or eyes.  Make sure to get the flu shot every year.  Contact a doctor if:  Your symptoms do not get better in 2 weeks. Get help right away if:  You cough up blood.  You have chest pain.  You have very bad shortness of breath.  You become dehydrated.  You faint (pass out) or keep feeling like you are going to pass out.  You keep throwing up (vomiting).  You have a very bad headache.  Your fever or chills gets worse. This information is not intended to replace advice given to you by your health care provider. Make sure you discuss any questions you have with your health care provider. Document Released: 05/01/2008 Document Revised: 06/21/2016 Document Reviewed: 05/03/2016 Elsevier Interactive Patient Education  2017 Elsevier Inc.  

## 2016-12-08 NOTE — Progress Notes (Signed)
Eric Obrien 51 y.o.   Chief Complaint  Patient presents with  . Cough  . Fatigue  . Chills  . Flu Vaccine    HISTORY OF PRESENT ILLNESS: This is a 51 y.o. male complaining of cough and congestion for several days.  Cough  This is a new problem. The current episode started 1 to 4 weeks ago. The problem has been gradually worsening. The problem occurs constantly. The cough is productive of sputum. Associated symptoms include chills, a fever, headaches, myalgias, nasal congestion and a sore throat. Pertinent negatives include no chest pain, eye redness, hemoptysis, rash, rhinorrhea, shortness of breath or wheezing. Nothing aggravates the symptoms. Risk factors: none. He has tried OTC cough suppressant for the symptoms. The treatment provided no relief. There is no history of asthma, bronchiectasis, COPD, emphysema or pneumonia.     Prior to Admission medications   Medication Sig Start Date End Date Taking? Authorizing Provider  methocarbamol (ROBAXIN) 500 MG tablet Take 1 tablet (500 mg total) by mouth 2 (two) times daily. 10/15/13  Yes Hannah Muthersbaugh, PA-C  Multiple Vitamin (MULTIVITAMIN WITH MINERALS) TABS tablet Take 1 tablet by mouth daily.   Yes Historical Provider, MD  pravastatin (PRAVACHOL) 80 MG tablet Take 1 tablet (80 mg total) by mouth daily. 10/22/15  Yes Posey Boyer, MD  amLODipine (NORVASC) 10 MG tablet TAKE 1 TABLET(10 MG) BY MOUTH DAILY Patient not taking: Reported on 12/08/2016 10/10/16   Chelle Jeffery, PA-C  hydrochlorothiazide (HYDRODIURIL) 25 MG tablet TAKE 1 TABLET BY MOUTH DAILY FOR BLOOD PRESSURE Patient not taking: Reported on 12/08/2016 10/16/16   Chelle Jeffery, PA-C  terbinafine (LAMISIL) 250 MG tablet Take 1 tablet (250 mg total) by mouth daily. Patient not taking: Reported on 12/08/2016 06/15/15   Posey Boyer, MD    No Known Allergies  Patient Active Problem List   Diagnosis Date Noted  . Hyperlipidemia 11/04/2015  . Obesity (BMI 30-39.9)  11/04/2015  . Hypertensive heart disease without CHF     Past Medical History:  Diagnosis Date  . Hypertension     No past surgical history on file.  Social History   Social History  . Marital status: Single    Spouse name: N/A  . Number of children: N/A  . Years of education: N/A   Occupational History  . Not on file.   Social History Main Topics  . Smoking status: Never Smoker  . Smokeless tobacco: Not on file  . Alcohol use No  . Drug use: No  . Sexual activity: Not on file   Other Topics Concern  . Not on file   Social History Narrative  . No narrative on file    No family history on file.   Review of Systems  Constitutional: Positive for chills and fever.  HENT: Positive for congestion, sinus pain and sore throat. Negative for nosebleeds and rhinorrhea.   Eyes: Negative for discharge and redness.  Respiratory: Positive for cough. Negative for hemoptysis, shortness of breath and wheezing.   Cardiovascular: Negative for chest pain.  Gastrointestinal: Negative for abdominal pain, diarrhea, nausea and vomiting.  Musculoskeletal: Positive for myalgias.  Skin: Negative for rash.  Neurological: Positive for headaches.  Endo/Heme/Allergies: Negative.   Psychiatric/Behavioral: Negative.   All other systems reviewed and are negative.  Vitals:   12/08/16 1118  BP: 110/84  Pulse: 96  Resp: 16  Temp: 98 F (36.7 C)     Physical Exam  Constitutional: He is oriented to person, place, and time.  He appears well-developed and well-nourished.  HENT:  Head: Normocephalic and atraumatic.  Right Ear: External ear normal.  Left Ear: External ear normal.  Nose: Nose normal.  Mouth/Throat: Oropharynx is clear and moist.  Eyes: Conjunctivae and EOM are normal. Pupils are equal, round, and reactive to light.  Neck: Normal range of motion. Neck supple.  Cardiovascular: Normal rate, regular rhythm, normal heart sounds and intact distal pulses.   Pulmonary/Chest:  Effort normal and breath sounds normal.  Abdominal: Soft. He exhibits no distension. There is no tenderness.  Musculoskeletal: Normal range of motion.  Neurological: He is alert and oriented to person, place, and time.  Skin: Skin is warm and dry. Capillary refill takes less than 2 seconds.  Psychiatric: He has a normal mood and affect. His behavior is normal.  Vitals reviewed.    ASSESSMENT & PLAN: [name] was seen today for cough, fatigue, chills and flu vaccine.  Diagnoses and all orders for this visit:  Acute bronchitis, unspecified organism  Cough  Fatigue, unspecified type  Other orders -     Cancel: Flu Vaccine QUAD 36+ mos IM -     azithromycin (ZITHROMAX) 250 MG tablet; Sig as indicated -     promethazine-codeine (PHENERGAN WITH CODEINE) 6.25-10 MG/5ML syrup; Take 5 mLs by mouth every 6 (six) hours as needed for cough.     Patient Instructions       IF you received an x-ray today, you will receive an invoice from [facility]. Please contact [facility] at [phone] with questions or concerns regarding your invoice.   IF you received labwork today, you will receive an invoice from [place] [facility]. Please contact [facility] at [phone] with questions or concerns regarding your invoice.   Our billing staff will not be able to assist you with questions regarding bills from these companies.  You will be contacted with the lab results as soon as they are available. The fastest way to get your results is to activate your My Chart account. Instructions are located on the last page of this paperwork. If you have not heard from [place] regarding the results in 2 weeks, please contact this office.     Acute Bronchitis, Adult Acute bronchitis is when air tubes (bronchi) in the lungs suddenly get swollen. The condition can make it hard to breathe. It can also cause these symptoms:  A cough.  Coughing up clear, yellow, or green mucus.  Wheezing.  Chest  congestion.  Shortness of breath.  A fever.  Body aches.  Chills.  A sore throat. Follow these instructions at home: Medicines  Take over-the-counter and prescription medicines only as told by your doctor.  If you were prescribed an antibiotic medicine, take it as told by your doctor. Do not stop taking the antibiotic even if you start to feel better. General instructions  Rest.  Drink enough fluids to keep your pee (urine) clear or pale yellow.  Avoid smoking and secondhand smoke. If you smoke and you need help quitting, ask your doctor. Quitting will help your lungs heal faster.  Use an inhaler, cool mist vaporizer, or humidifier as told by your doctor.  Keep all follow-up visits as told by your doctor. This is important. How is this prevented? To lower your risk of getting this condition again:  Wash your hands often with soap and water. If you cannot use soap and water, use hand sanitizer.  Avoid contact with people who have cold symptoms.  Try not to touch your hands to your  mouth, nose, or eyes.  Make sure to get the flu shot every year. Contact a doctor if:  Your symptoms do not get better in 2 weeks. Get help right away if:  You cough up blood.  You have chest pain.  You have very bad shortness of breath.  You become dehydrated.  You faint (pass out) or keep feeling like you are going to pass out.  You keep throwing up (vomiting).  You have a very bad headache.  Your fever or chills gets worse. This information is not intended to replace advice given to you by your health care provider. Make sure you discuss any questions you have with your health care provider. Document Released: 05/01/2008 Document Revised: 06/21/2016 Document Reviewed: 05/03/2016 Elsevier Interactive Patient Education  2017 Elsevier Inc.     Agustina Caroli, MD [facility]

## 2017-01-01 ENCOUNTER — Ambulatory Visit (INDEPENDENT_AMBULATORY_CARE_PROVIDER_SITE_OTHER): Payer: BLUE CROSS/BLUE SHIELD | Admitting: Urgent Care

## 2017-01-01 VITALS — BP 116/80 | HR 74 | Temp 98.5°F | Resp 16 | Ht 64.0 in | Wt 200.6 lb

## 2017-01-01 DIAGNOSIS — R21 Rash and other nonspecific skin eruption: Secondary | ICD-10-CM | POA: Diagnosis not present

## 2017-01-01 DIAGNOSIS — R05 Cough: Secondary | ICD-10-CM | POA: Diagnosis not present

## 2017-01-01 DIAGNOSIS — R0981 Nasal congestion: Secondary | ICD-10-CM

## 2017-01-01 DIAGNOSIS — R059 Cough, unspecified: Secondary | ICD-10-CM

## 2017-01-01 MED ORDER — BENZONATATE 100 MG PO CAPS: 100.0000 mg | ORAL_CAPSULE | Freq: Three times a day (TID) | ORAL | 0 refills | Status: DC | PRN

## 2017-01-01 MED ORDER — CETIRIZINE HCL 10 MG PO TABS: 10.0000 mg | ORAL_TABLET | Freq: Every day | ORAL | 11 refills | Status: DC

## 2017-01-01 MED ORDER — HYDROCOD POLST-CPM POLST ER 10-8 MG/5ML PO SUER: 5.0000 mL | Freq: Every evening | ORAL | 0 refills | Status: DC | PRN

## 2017-01-01 MED ORDER — METHYLPREDNISOLONE ACETATE 80 MG/ML IJ SUSP
80.0000 mg | Freq: Once | INTRAMUSCULAR | Status: AC
  Administered 2017-01-01: 80 mg via INTRAMUSCULAR

## 2017-01-01 MED ORDER — PSEUDOEPHEDRINE HCL ER 120 MG PO TB12: 120.0000 mg | ORAL_TABLET | Freq: Two times a day (BID) | ORAL | 3 refills | Status: DC

## 2017-01-01 NOTE — Patient Instructions (Addendum)
For your skin, use over-the-counter Cortizone (hydrocortisone) 1% twice daily for 1 week.   Cough, Adult Coughing is a reflex that clears your throat and your airways. Coughing helps to heal and protect your lungs. It is normal to cough occasionally, but a cough that happens with other symptoms or lasts a long time may be a sign of a condition that needs treatment. A cough may last only 2-3 weeks (acute), or it may last longer than 8 weeks (chronic). What are the causes? Coughing is commonly caused by:  Breathing in substances that irritate your lungs.  A viral or bacterial respiratory infection.  Allergies.  Asthma.  Postnasal drip.  Smoking.  Acid backing up from the stomach into the esophagus (gastroesophageal reflux).  Certain medicines.  Chronic lung problems, including COPD (or rarely, lung cancer).  Other medical conditions such as heart failure. Follow these instructions at home: Pay attention to any changes in your symptoms. Take these actions to help with your discomfort:  Take medicines only as told by your health care provider.  If you were prescribed an antibiotic medicine, take it as told by your health care provider. Do not stop taking the antibiotic even if you start to feel better.  Talk with your health care provider before you take a cough suppressant medicine.  Drink enough fluid to keep your urine clear or pale yellow.  If the air is dry, use a cold steam vaporizer or humidifier in your bedroom or your home to help loosen secretions.  Avoid anything that causes you to cough at work or at home.  If your cough is worse at night, try sleeping in a semi-upright position.  Avoid cigarette smoke. If you smoke, quit smoking. If you need help quitting, ask your health care provider.  Avoid caffeine.  Avoid alcohol.  Rest as needed. Contact a health care provider if:  You have new symptoms.  You cough up pus.  Your cough does not get better after  2-3 weeks, or your cough gets worse.  You cannot control your cough with suppressant medicines and you are losing sleep.  You develop pain that is getting worse or pain that is not controlled with pain medicines.  You have a fever.  You have unexplained weight loss.  You have night sweats. Get help right away if:  You cough up blood.  You have difficulty breathing.  Your heartbeat is very fast. This information is not intended to replace advice given to you by your health care provider. Make sure you discuss any questions you have with your health care provider. Document Released: 05/12/2011 Document Revised: 04/20/2016 Document Reviewed: 01/20/2015 Elsevier Interactive Patient Education  2017 [employer].     IF you received an x-ray today, you will receive an invoice from [facility]. Please contact [facility] at [phone] with questions or concerns regarding your invoice.   IF you received labwork today, you will receive an invoice from [place]. Please contact [facility] at [phone] with questions or concerns regarding your invoice.   Our billing staff will not be able to assist you with questions regarding bills from these companies.  You will be contacted with the lab results as soon as they are available. The fastest way to get your results is to activate your My Chart account. Instructions are located on the last page of this paperwork. If you have not heard from [place] regarding the results in 2 weeks, please contact this office.

## 2017-01-01 NOTE — Progress Notes (Signed)
  MRN: [id number] DOB: 1966/01/01  Subjective:   Demarian Gillihan is a 51 y.o. male presenting for chief complaint of Cough (nonproductive x 3 days) and Chills  Reports 3 day history of productive cough, mild shob, chills. Cough causes pain over flank sides. Also has nasal congestion. Patient was seen 12/08/2016 and started on azithromycin with significant improvement of his cough symptoms then. Has tried otc cough medications. Denies fever, chest pain, wheezing, sore throat, sinus pain, ear pain, n/v, abdominal pain, body aches. Did not have flu shot this season. Denies smoking cigarettes.  Jessey has a current medication list which includes the following prescription(s): amlodipine, hydrochlorothiazide, multivitamin with minerals, terbinafine, methocarbamol, and pravastatin. Also has No Known Allergies.  Jarrick  has a past medical history of Hypertension. Also  has no past surgical history on file.  Objective:   Vitals: BP 116/80 (BP Location: Right Arm, Patient Position: Sitting, Cuff Size: Large)   Pulse 74   Temp 98.5 F (36.9 C) (Oral)   Resp 16   Ht 5\' 4"  (1.626 m)   Wt 200 lb 9.6 oz (91 kg)   SpO2 97%   BMI 34.43 kg/m   Physical Exam  Constitutional: He is oriented to person, place, and time. He appears well-developed and well-nourished.  HENT:  TM's intact bilaterally, no effusions or erythema. Nasal turbinates boggy and edematous with thick white mucus, nasal passages minimally patent. No sinus tenderness. Oropharynx with mild post-nasal drainage, mucous membranes moist, dentition in good repair.  Eyes: Right eye exhibits no discharge. Left eye exhibits no discharge.  Neck: Normal range of motion. Neck supple.  Cardiovascular: Normal rate, regular rhythm and intact distal pulses.  Exam reveals no gallop and no friction rub.   No murmur heard. Pulmonary/Chest: No respiratory distress. He has no wheezes. He has no rales.  Lymphadenopathy:    He has no cervical adenopathy.    Neurological: He is alert and oriented to person, place, and time.  Skin: Skin is warm and dry. Rash (dry scaly patch over right lateral bicep) noted.   Assessment and Plan :   1. Cough 2. Nasal congestion - Patient's physical exam findings reassuring. I will use steroid injection to help stop cough and address possible rhinitis. He is to start Zyrtec, Sudafed. Use cough suppression medications otherwise.  3. Rash and nonspecific skin eruption - Patient reported dry itchy spot over his right bicep at the end of his visit. I recommended he use otc cortisone cream, rtc in 1 week if no improvement.   Jaynee Eagles, PA-C [facility] [phone] 01/01/2017  2:40 PM

## 2017-01-10 DIAGNOSIS — H01021 Squamous blepharitis right upper eyelid: Secondary | ICD-10-CM | POA: Diagnosis not present

## 2017-01-10 DIAGNOSIS — H04123 Dry eye syndrome of bilateral lacrimal glands: Secondary | ICD-10-CM | POA: Diagnosis not present

## 2017-01-10 DIAGNOSIS — H10413 Chronic giant papillary conjunctivitis, bilateral: Secondary | ICD-10-CM | POA: Diagnosis not present

## 2017-01-10 DIAGNOSIS — H40013 Open angle with borderline findings, low risk, bilateral: Secondary | ICD-10-CM | POA: Diagnosis not present

## 2017-01-29 ENCOUNTER — Other Ambulatory Visit: Payer: Self-pay | Admitting: Physician Assistant

## 2017-01-29 DIAGNOSIS — I10 Essential (primary) hypertension: Secondary | ICD-10-CM

## 2017-01-29 NOTE — Progress Notes (Signed)
Script destroyed per me. Written 12/08/16

## 2017-06-15 IMAGING — CR DG CHEST 2V
2 series · 2 of 2 positions shown · non-contrast
Comparison: April 22, 2013

CLINICAL DATA: Chest pain, shortness of breath since this morning

EXAM:
CHEST  2 VIEW

[PA]
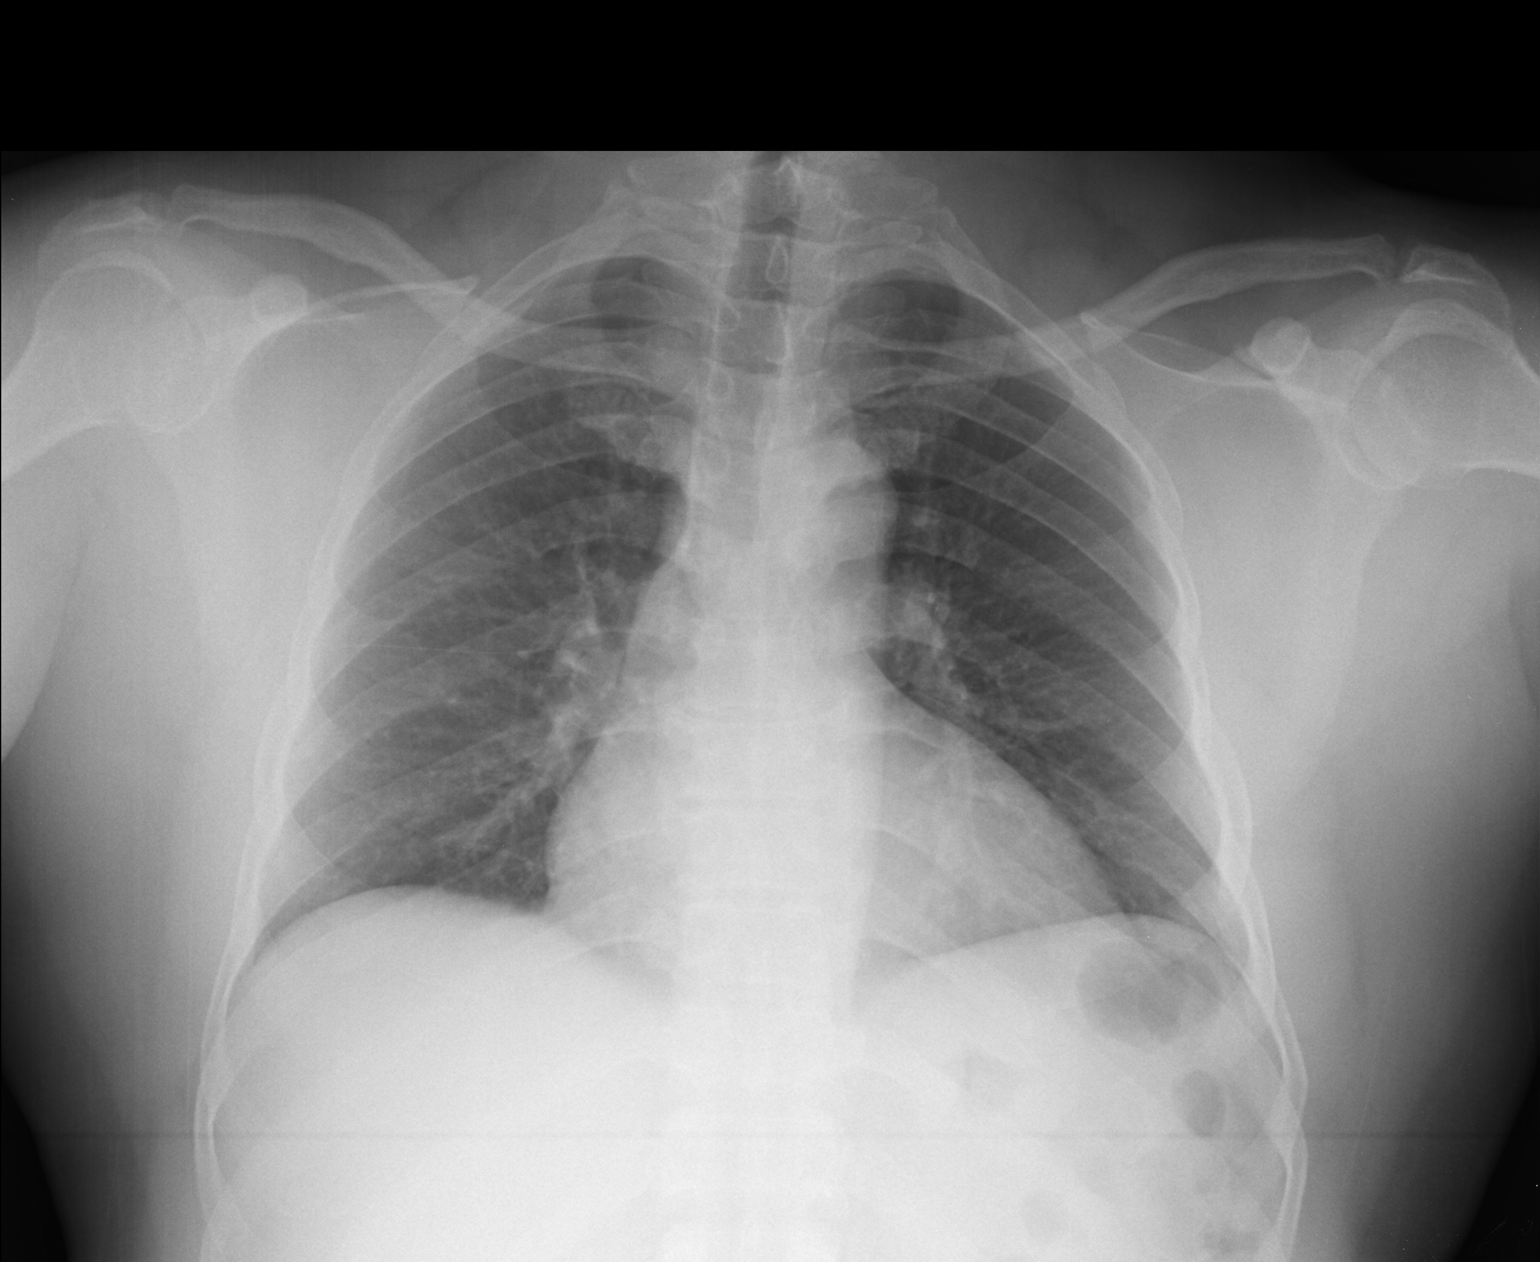

[lateral]
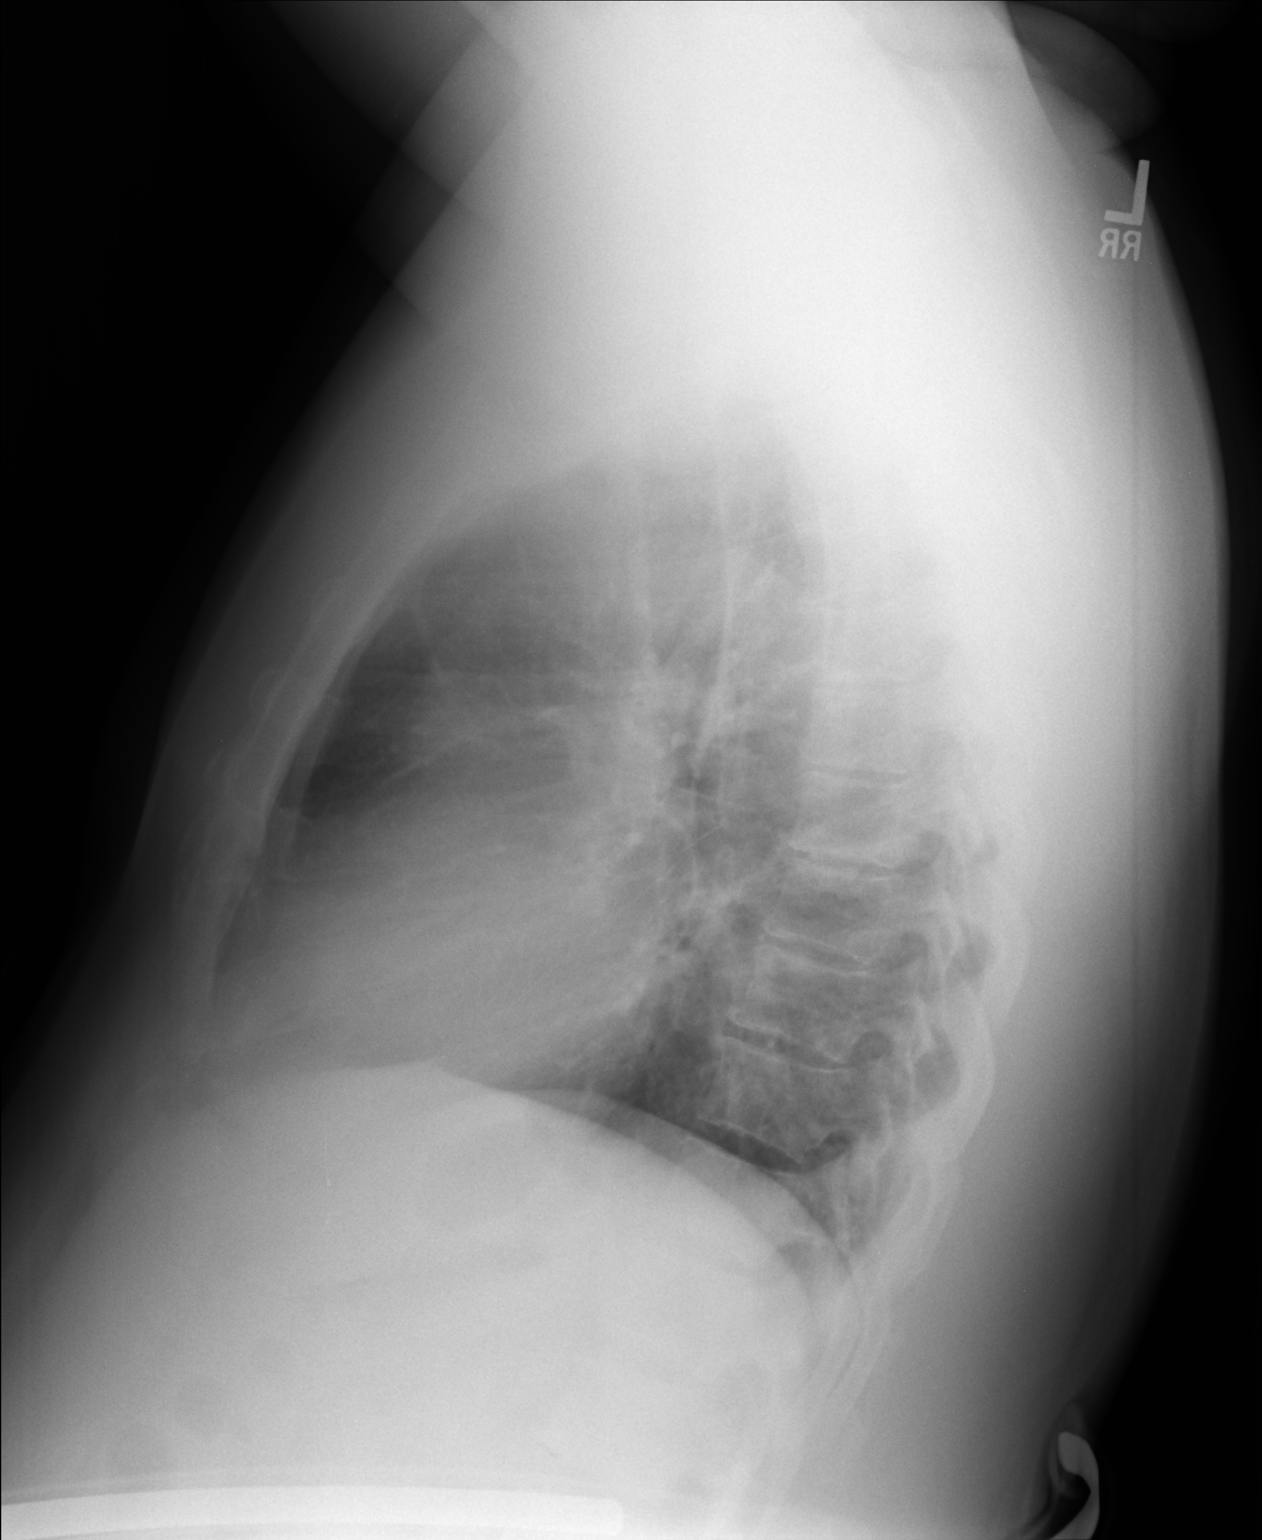

[2 of 2 positions shown; findings below may reference images not displayed]

FINDINGS: The heart size and mediastinal contours are within normal limits.
Both lungs are clear. The visualized skeletal structures are
unremarkable.
IMPRESSION: No active cardiopulmonary disease.

## 2017-09-04 ENCOUNTER — Ambulatory Visit (INDEPENDENT_AMBULATORY_CARE_PROVIDER_SITE_OTHER): Payer: BLUE CROSS/BLUE SHIELD | Admitting: Urgent Care

## 2017-09-04 ENCOUNTER — Encounter: Payer: Self-pay | Admitting: Urgent Care

## 2017-09-04 VITALS — BP 162/102 | HR 64 | Temp 98.6°F | Resp 17 | Ht 64.5 in | Wt 208.0 lb

## 2017-09-04 DIAGNOSIS — Z6835 Body mass index (BMI) 35.0-35.9, adult: Secondary | ICD-10-CM

## 2017-09-04 DIAGNOSIS — I10 Essential (primary) hypertension: Secondary | ICD-10-CM

## 2017-09-04 DIAGNOSIS — R030 Elevated blood-pressure reading, without diagnosis of hypertension: Secondary | ICD-10-CM

## 2017-09-04 DIAGNOSIS — Z7189 Other specified counseling: Secondary | ICD-10-CM

## 2017-09-04 DIAGNOSIS — E669 Obesity, unspecified: Secondary | ICD-10-CM

## 2017-09-04 DIAGNOSIS — Z7185 Encounter for immunization safety counseling: Secondary | ICD-10-CM

## 2017-09-04 DIAGNOSIS — Z23 Encounter for immunization: Secondary | ICD-10-CM

## 2017-09-04 MED ORDER — HYDROCHLOROTHIAZIDE 25 MG PO TABS: 25.0000 mg | ORAL_TABLET | Freq: Every day | ORAL | 1 refills | Status: DC

## 2017-09-04 MED ORDER — TYPHOID VACCINE PO CPDR: 1.0000 | DELAYED_RELEASE_CAPSULE | ORAL | 0 refills | Status: DC

## 2017-09-04 MED ORDER — AMLODIPINE BESYLATE 10 MG PO TABS: 10.0000 mg | ORAL_TABLET | Freq: Every day | ORAL | 1 refills | Status: DC

## 2017-09-04 MED ORDER — DOXYCYCLINE HYCLATE 100 MG PO CAPS: 100.0000 mg | ORAL_CAPSULE | Freq: Every day | ORAL | 1 refills | Status: DC

## 2017-09-04 NOTE — Progress Notes (Signed)
   MRN: [id number] DOB: 1966-11-16  Subjective:   Eric Obrien is a 51 y.o. male presenting for follow up on Hypertension.   Currently not taking any medication for his high blood pressure. Is supposed to be on amlodipine and HCTZ. Patient is not checking blood pressure at home. Does not avoid salt in diet, is not exercising. Denies lightheadedness, dizziness, chronic headache, blurred vision, chest pain, shortness of breath, heart racing, palpitations, nausea, vomiting, abdominal pain, hematuria, lower leg swelling. Denies smoking cigarettes or drinking alcohol. Patient will be traveling to [place] in November 2018, would like immunizations updated.  Eric Obrien has a current medication list which includes the following prescription(s): multivitamin with minerals, amlodipine, and hydrochlorothiazide. Also has No Known Allergies.  Eric Obrien  has a past medical history of Hypertension. Also  has no past surgical history on file.  Objective:   Vitals: BP (!) 162/102   Pulse 64   Temp 98.6 F (37 C) (Oral)   Resp 17   Ht 5' 4.5" (1.638 m)   Wt 208 lb (94.3 kg)   SpO2 98%   BMI 35.15 kg/m   Physical Exam  Constitutional: He is oriented to person, place, and time. He appears well-developed and well-nourished.  HENT:  Mouth/Throat: Oropharynx is clear and moist.  Eyes: No scleral icterus.  Cardiovascular: Normal rate, regular rhythm and intact distal pulses.  Exam reveals no gallop and no friction rub.   No murmur heard. Pulmonary/Chest: No respiratory distress. He has no wheezes. He has no rales.  Abdominal: Soft. Bowel sounds are normal. He exhibits no distension and no mass. There is no tenderness. There is no guarding.  Musculoskeletal: He exhibits no edema.  Neurological: He is alert and oriented to person, place, and time.  Skin: Skin is warm and dry.   Assessment and Plan :   1. Essential hypertension 2. Elevated blood pressure reading - Not controlled, counseled on compliance, dietary  modifications. Restart amlodipine in week 1, then HCTZ together with this in week 2. Follow up in 4 weeks.  3. Class 2 obesity without serious comorbidity with body mass index (BMI) of 35.0 to 35.9 in adult, unspecified obesity type - Lipid panel pending.  4. Need for influenza vaccination - Flu Vaccine QUAD 36+ mos IM  5. Immunization counseling - Will cover for hepatitis A, typhoid, malaria.  Jaynee Eagles, PA-C [facility] [phone] 09/04/2017  11:53 AM

## 2017-09-04 NOTE — Patient Instructions (Addendum)
Hypertension Hypertension, commonly called high blood pressure, is when the force of blood pumping through the arteries is too strong. The arteries are the blood vessels that carry blood from the heart throughout the body. Hypertension forces the heart to work harder to pump blood and may cause arteries to become narrow or stiff. Having untreated or uncontrolled hypertension can cause heart attacks, strokes, kidney disease, and other problems. A blood pressure reading consists of a higher number over a lower number. Ideally, your blood pressure should be below 120/80. The first ("top") number is called the systolic pressure. It is a measure of the pressure in your arteries as your heart beats. The second ("bottom") number is called the diastolic pressure. It is a measure of the pressure in your arteries as the heart relaxes. What are the causes? The cause of this condition is not known. What increases the risk? Some risk factors for high blood pressure are under your control. Others are not. Factors you can change  Smoking.  Having type 2 diabetes mellitus, high cholesterol, or both.  Not getting enough exercise or physical activity.  Being overweight.  Having too much fat, sugar, calories, or salt (sodium) in your diet.  Drinking too much alcohol. Factors that are difficult or impossible to change  Having chronic kidney disease.  Having a family history of high blood pressure.  Age. Risk increases with age.  Race. You may be at higher risk if you are African-American.  Gender. Men are at higher risk than women before age 45. After age 65, women are at higher risk than men.  Having obstructive sleep apnea.  Stress. What are the signs or symptoms? Extremely high blood pressure (hypertensive crisis) may cause:  Headache.  Anxiety.  Shortness of breath.  Nosebleed.  Nausea and vomiting.  Severe chest pain.  Jerky movements you cannot control (seizures).  How is this  diagnosed? This condition is diagnosed by measuring your blood pressure while you are seated, with your arm resting on a surface. The cuff of the blood pressure monitor will be placed directly against the skin of your upper arm at the level of your heart. It should be measured at least twice using the same arm. Certain conditions can cause a difference in blood pressure between your right and left arms. Certain factors can cause blood pressure readings to be lower or higher than normal (elevated) for a short period of time:  When your blood pressure is higher when you are in a health care provider's office than when you are at home, this is called white coat hypertension. Most people with this condition do not need medicines.  When your blood pressure is higher at home than when you are in a health care provider's office, this is called masked hypertension. Most people with this condition may need medicines to control blood pressure.  If you have a high blood pressure reading during one visit or you have normal blood pressure with other risk factors:  You may be asked to return on a different day to have your blood pressure checked again.  You may be asked to monitor your blood pressure at home for 1 week or longer.  If you are diagnosed with hypertension, you may have other blood or imaging tests to help your health care provider understand your overall risk for other conditions. How is this treated? This condition is treated by making healthy lifestyle changes, such as eating healthy foods, exercising more, and reducing your alcohol intake. Your   health care provider may prescribe medicine if lifestyle changes are not enough to get your blood pressure under control, and if:  Your systolic blood pressure is above 130.  Your diastolic blood pressure is above 80.  Your personal target blood pressure may vary depending on your medical conditions, your age, and other factors. Follow these  instructions at home: Eating and drinking  Eat a diet that is high in fiber and potassium, and low in sodium, added sugar, and fat. An example eating plan is called the DASH (Dietary Approaches to Stop Hypertension) diet. To eat this way: ? Eat plenty of fresh fruits and vegetables. Try to fill half of your plate at each meal with fruits and vegetables. ? Eat whole grains, such as whole wheat pasta, brown rice, or whole grain bread. Fill about one quarter of your plate with whole grains. ? Eat or drink low-fat dairy products, such as skim milk or low-fat yogurt. ? Avoid fatty cuts of meat, processed or cured meats, and poultry with skin. Fill about one quarter of your plate with lean proteins, such as fish, chicken without skin, beans, eggs, and tofu. ? Avoid premade and processed foods. These tend to be higher in sodium, added sugar, and fat.  Reduce your daily sodium intake. Most people with hypertension should eat less than 1,500 mg of sodium a day.  Limit alcohol intake to no more than 1 drink a day for nonpregnant women and 2 drinks a day for men. One drink equals 12 oz of beer, 5 oz of wine, or 1 oz of hard liquor. Lifestyle  Work with your health care provider to maintain a healthy body weight or to lose weight. Ask what an ideal weight is for you.  Get at least 30 minutes of exercise that causes your heart to beat faster (aerobic exercise) most days of the week. Activities may include walking, swimming, or biking.  Include exercise to strengthen your muscles (resistance exercise), such as pilates or lifting weights, as part of your weekly exercise routine. Try to do these types of exercises for 30 minutes at least 3 days a week.  Do not use any products that contain nicotine or tobacco, such as cigarettes and e-cigarettes. If you need help quitting, ask your health care provider.  Monitor your blood pressure at home as told by your health care provider.  Keep all follow-up visits as  told by your health care provider. This is important. Medicines  Take over-the-counter and prescription medicines only as told by your health care provider. Follow directions carefully. Blood pressure medicines must be taken as prescribed.  Do not skip doses of blood pressure medicine. Doing this puts you at risk for problems and can make the medicine less effective.  Ask your health care provider about side effects or reactions to medicines that you should watch for. Contact a health care provider if:  You think you are having a reaction to a medicine you are taking.  You have headaches that keep coming back (recurring).  You feel dizzy.  You have swelling in your ankles.  You have trouble with your vision. Get help right away if:  You develop a severe headache or confusion.  You have unusual weakness or numbness.  You feel faint.  You have severe pain in your chest or abdomen.  You vomit repeatedly.  You have trouble breathing. Summary  Hypertension is when the force of blood pumping through your arteries is too strong. If this condition is not   controlled, it may put you at risk for serious complications.  Your personal target blood pressure may vary depending on your medical conditions, your age, and other factors. For most people, a normal blood pressure is less than 120/80.  Hypertension is treated with lifestyle changes, medicines, or a combination of both. Lifestyle changes include weight loss, eating a healthy, low-sodium diet, exercising more, and limiting alcohol. This information is not intended to replace advice given to you by your health care provider. Make sure you discuss any questions you have with your health care provider. Document Released: 11/13/2005 Document Revised: 10/11/2016 Document Reviewed: 10/11/2016 Elsevier Interactive Patient Education  2018 Phenix Vaccine, Live oral enteric-coated capsules What is this  medicine? TYPHOID ORAL VACCINE (TYE foid vax EEN) is used to prevent typhoid infection. The vaccine is recommended if you travel to parts of the world where typhoid is common. This medicine may be used for other purposes; ask your health care provider or pharmacist if you have questions. COMMON BRAND NAME(S): Vivotif (PaxVax[employer] Inc[employer] Vivotif Berna What should I tell my health care provider before I take this medicine? They need to know if you have any of these conditions: -active infection with fever -cancer -HIV or AIDS -immune system problems -low blood counts, like low white cell, platelet, or red cell counts -recent or ongoing radiation therapy -stomach or intestine problems -an unusual or allergic reaction to vaccines, yeast, other medicines, foods, dyes, or preservatives -pregnant or trying to get pregnant -breast-feeding How should I use this medicine? Take this medicine by mouth with a glass of water. It will only be given to you by a health care professional. Take this medicine on an empty stomach, at least 1 hour before food. Do not take with food. Do not cut, crush or chew this medicine. A copy of Vaccine Information Statements will be given before each vaccination. Read this sheet carefully each time. The sheet may change frequently. Talk to your pediatrician regarding the use of this medicine in children. While this drug may be prescribed for children as young as 6 years for selected conditions, precautions do apply. Overdosage: If you think you have taken too much of this medicine contact a poison control center or emergency room at once. NOTE: This medicine is only for you. Do not share this medicine with others. What if I miss a dose? Keep appointments for follow-up doses as directed. It is important not to miss your dose. Call your doctor or health care professional if you are unable to keep an appointment. Four doses, given 2 days apart, are needed for protection. The last  dose should be given at least 1 week before travel to allow the vaccine time to work. What may interact with this medicine? Do not take this medicine with any of the following medications: -certain antibiotics like sulfamethoxazole (SMZ) or other sulfonamides This medicine may also interact with the following medications: -antimalarial drugs -immune globulins -medicines for organ transplant -medicines to treat cancer -other vaccines -some medicines for arthritis -steroid medicines like prednisone or cortisone This list may not describe all possible interactions. Give your health care provider a list of all the medicines, herbs, non-prescription drugs, or dietary supplements you use. Also tell them if you smoke, drink alcohol, or use illegal drugs. Some items may interact with your medicine. What should I watch for while using this medicine? This vaccine, like all vaccines, may not fully protect everyone. Report any side effects that are worrisome  to your doctor right away. What side effects may I notice from receiving this medicine? Side effects that you should report to your doctor or health care professional as soon as possible: -allergic reactions like skin rash, itching or hives, swelling of the face, lips, or tongue Side effects that usually do not require medical attention (report to your doctor or health care professional if they continue or are bothersome): -diarrhea -fever -headache -nausea, vomiting -stomach pain This list may not describe all possible side effects. Call your doctor for medical advice about side effects. You may report side effects to FDA at 1-800-FDA-1088. Where should I keep my medicine? Keep out of the reach of children. Store refrigerated between 2 and 8 degrees C (35.6 and 46.4 degrees F). Do not freeze. Keep this vaccine in the original container. This vaccine is given in a clinic, pharmacy, doctor's office, or other health care setting and will not be  stored at home. NOTE: This sheet is a summary. It may not cover all possible information. If you have questions about this medicine, talk to your doctor, pharmacist, or health care provider.  2018 Elsevier/Gold Standard (2015-12-16 12:17:58)   Doxycycline tablets or capsules What is this medicine? DOXYCYCLINE (dox i SYE kleen) is a tetracycline antibiotic. It kills certain bacteria or stops their growth. It is used to treat many kinds of infections, like dental, skin, respiratory, and urinary tract infections. It also treats acne, Lyme disease, malaria, and certain sexually transmitted infections. This medicine may be used for other purposes; ask your health care provider or pharmacist if you have questions. COMMON BRAND NAME(S): Acticlate, Adoxa, Adoxa CK, Adoxa Pak, Adoxa TT, Alodox, Avidoxy, Doxal, Mondoxyne NL, Monodox, Morgidox 1x, Morgidox 1x Kit, Morgidox 2x, Morgidox 2x Kit, NutriDox, Ocudox, TARGADOX, Vibra-Tabs, Vibramycin What should I tell my health care provider before I take this medicine? They need to know if you have any of these conditions: -liver disease -long exposure to sunlight like working outdoors -stomach problems like colitis -an unusual or allergic reaction to doxycycline, tetracycline antibiotics, other medicines, foods, dyes, or preservatives -pregnant or trying to get pregnant -breast-feeding How should I use this medicine? Take this medicine by mouth with a full glass of water. Follow the directions on the prescription label. It is best to take this medicine without food, but if it upsets your stomach take it with food. Take your medicine at regular intervals. Do not take your medicine more often than directed. Take all of your medicine as directed even if you think you are better. Do not skip doses or stop your medicine early. Talk to your pediatrician regarding the use of this medicine in children. While this drug may be prescribed for selected conditions,  precautions do apply. Overdosage: If you think you have taken too much of this medicine contact a poison control center or emergency room at once. NOTE: This medicine is only for you. Do not share this medicine with others. What if I miss a dose? If you miss a dose, take it as soon as you can. If it is almost time for your next dose, take only that dose. Do not take double or extra doses. What may interact with this medicine? -antacids -barbiturates -birth control pills -bismuth subsalicylate -carbamazepine -methoxyflurane -other antibiotics -phenytoin -vitamins that contain iron -warfarin This list may not describe all possible interactions. Give your health care provider a list of all the medicines, herbs, non-prescription drugs, or dietary supplements you use. Also tell them if you smoke,  drink alcohol, or use illegal drugs. Some items may interact with your medicine. What should I watch for while using this medicine? Tell your doctor or health care professional if your symptoms do not improve. Do not treat diarrhea with over the counter products. Contact your doctor if you have diarrhea that lasts more than 2 days or if it is severe and watery. Do not take this medicine just before going to bed. It may not dissolve properly when you lay down and can cause pain in your throat. Drink plenty of fluids while taking this medicine to also help reduce irritation in your throat. This medicine can make you more sensitive to the sun. Keep out of the sun. If you cannot avoid being in the sun, wear protective clothing and use sunscreen. Do not use sun lamps or tanning beds/booths. Birth control pills may not work properly while you are taking this medicine. Talk to your doctor about using an extra method of birth control. If you are being treated for a sexually transmitted infection, avoid sexual contact until you have finished your treatment. Your sexual partner may also need treatment. Avoid  antacids, aluminum, calcium, magnesium, and iron products for 4 hours before and 2 hours after taking a dose of this medicine. If you are using this medicine to prevent malaria, you should still protect yourself from contact with mosquitos. Stay in screened-in areas, use mosquito nets, keep your body covered, and use an insect repellent. What side effects may I notice from receiving this medicine? Side effects that you should report to your doctor or health care professional as soon as possible: -allergic reactions like skin rash, itching or hives, swelling of the face, lips, or tongue -difficulty breathing -fever -itching in the rectal or genital area -pain on swallowing -redness, blistering, peeling or loosening of the skin, including inside the mouth -severe stomach pain or cramps -unusual bleeding or bruising -unusually weak or tired -yellowing of the eyes or skin Side effects that usually do not require medical attention (report to your doctor or health care professional if they continue or are bothersome): -diarrhea -loss of appetite -nausea, vomiting This list may not describe all possible side effects. Call your doctor for medical advice about side effects. You may report side effects to FDA at 1-800-FDA-1088. Where should I keep my medicine? Keep out of the reach of children. Store at room temperature, below 30 degrees C (86 degrees F). Protect from light. Keep container tightly closed. Throw away any unused medicine after the expiration date. Taking this medicine after the expiration date can make you seriously ill. NOTE: This sheet is a summary. It may not cover all possible information. If you have questions about this medicine, talk to your doctor, pharmacist, or health care provider.  2018 Elsevier/Gold Standard (2015-12-15 17:11:22)   IF you received an x-ray today, you will receive an invoice from [facility]. Please contact [facility] at [phone]  with questions or concerns regarding your invoice.   IF you received labwork today, you will receive an invoice from [facility]. Please contact [facility] at [phone] with questions or concerns regarding your invoice.   Our billing staff will not be able to assist you with questions regarding bills from these companies.  You will be contacted with the lab results as soon as they are available. The fastest way to get your results is to activate your My Chart account. Instructions are located on the last page of this paperwork. If you have not heard from  [place] regarding the results in 2 weeks, please contact this office.

## 2017-09-05 LAB — COMPREHENSIVE METABOLIC PANEL
ALBUMIN: 4.4 g/dL (ref 3.5–5.5)
ALT: 30 IU/L (ref 0–44)
AST: 28 IU/L (ref 0–40)
Albumin/Globulin Ratio: 1.5 (ref 1.2–2.2)
Alkaline Phosphatase: 71 IU/L (ref 39–117)
BUN / CREAT RATIO: 11 (ref 9–20)
BUN: 12 mg/dL (ref 6–24)
CHLORIDE: 99 mmol/L (ref 96–106)
CO2: 26 mmol/L (ref 20–29)
CREATININE: 1.06 mg/dL (ref 0.76–1.27)
Calcium: 9.8 mg/dL (ref 8.7–10.2)
GFR calc Af Amer: 94 mL/min/{1.73_m2} (ref >59)
GFR calc non Af Amer: 81 mL/min/{1.73_m2} (ref >59)
GLUCOSE: 86 mg/dL (ref 65–99)
Globulin, Total: 2.9 g/dL (ref 1.5–4.5)
Potassium: 3.9 mmol/L (ref 3.5–5.2)
Sodium: 140 mmol/L (ref 134–144)
Total Protein: 7.3 g/dL (ref 6.0–8.5)

## 2017-09-05 LAB — LIPID PANEL
CHOLESTEROL TOTAL: 260 mg/dL — AB (ref 100–199)
Chol/HDL Ratio: 7.6 ratio — ABNORMAL HIGH (ref 0.0–5.0)
HDL: 34 mg/dL — AB (ref >39)
LDL CALC: 173 mg/dL — AB (ref 0–99)
Triglycerides: 267 mg/dL — ABNORMAL HIGH (ref 0–149)
VLDL CHOLESTEROL CAL: 53 mg/dL — AB (ref 5–40)

## 2017-09-05 LAB — MICROALBUMIN / CREATININE URINE RATIO
Creatinine, Urine: 145 mg/dL
Microalb/Creat Ratio: 42.9 mg/g creat — ABNORMAL HIGH (ref 0.0–30.0)
Microalbumin, Urine: 62.2 ug/mL

## 2017-09-06 ENCOUNTER — Other Ambulatory Visit: Payer: Self-pay | Admitting: Urgent Care

## 2017-09-06 MED ORDER — ATORVASTATIN CALCIUM 40 MG PO TABS: 40.0000 mg | ORAL_TABLET | Freq: Every day | ORAL | 3 refills | Status: DC

## 2017-09-18 ENCOUNTER — Ambulatory Visit: Payer: BLUE CROSS/BLUE SHIELD | Admitting: Urgent Care

## 2017-10-04 ENCOUNTER — Ambulatory Visit: Payer: BLUE CROSS/BLUE SHIELD | Admitting: Urgent Care

## 2017-10-05 ENCOUNTER — Ambulatory Visit (INDEPENDENT_AMBULATORY_CARE_PROVIDER_SITE_OTHER): Payer: BLUE CROSS/BLUE SHIELD | Admitting: Urgent Care

## 2017-10-05 ENCOUNTER — Encounter: Payer: Self-pay | Admitting: Urgent Care

## 2017-10-05 VITALS — BP 148/77 | HR 79 | Temp 98.1°F | Resp 18 | Ht 64.5 in | Wt 206.6 lb

## 2017-10-05 DIAGNOSIS — M7711 Lateral epicondylitis, right elbow: Secondary | ICD-10-CM | POA: Diagnosis not present

## 2017-10-05 DIAGNOSIS — R030 Elevated blood-pressure reading, without diagnosis of hypertension: Secondary | ICD-10-CM

## 2017-10-05 DIAGNOSIS — M79631 Pain in right forearm: Secondary | ICD-10-CM | POA: Diagnosis not present

## 2017-10-05 DIAGNOSIS — I10 Essential (primary) hypertension: Secondary | ICD-10-CM

## 2017-10-05 MED ORDER — MELOXICAM 7.5 MG PO TABS: 7.5000 mg | ORAL_TABLET | Freq: Every day | ORAL | 1 refills | Status: DC

## 2017-10-05 MED ORDER — LOSARTAN POTASSIUM 50 MG PO TABS: 50.0000 mg | ORAL_TABLET | Freq: Every day | ORAL | 1 refills | Status: DC

## 2017-10-05 MED ORDER — CELECOXIB 100 MG PO CAPS: 100.0000 mg | ORAL_CAPSULE | Freq: Two times a day (BID) | ORAL | 1 refills | Status: DC

## 2017-10-05 NOTE — Progress Notes (Signed)
    MRN: [id number] DOB: 1966-04-02  Subjective:   Eric Obrien is a 51 y.o. male presenting for follow up on Hypertension.   Currently managed with amlodipine, HCTZ. Avoids salt in diet, is exercising. Denies dizziness, chronic headache, chest pain, shortness of breath, heart racing, palpitations, nausea, vomiting, abdominal pain, hematuria, lower leg swelling. Denies smoking cigarettes. Has occasional drink of alcohol. Reports 1 week history of right elbow pain, forearm pain. Pain is elicited with rotation of his forearm, lifting. Has tried hot patches, Beng[place] with minimal relief. Denies trauma, falls, redness, warmth.  Mustapha has a current medication list which includes the following prescription(s): amlodipine, atorvastatin, hydrochlorothiazide, and multivitamin with minerals. Also has No Known Allergies.  Jaivon  has a past medical history of Hypertension. Also  has no past surgical history on file.  Objective:   Vitals: BP (!) 148/77   Pulse 79   Temp 98.1 F (36.7 C) (Oral)   Resp 18   Ht 5' 4.5" (1.638 m)   Wt 206 lb 9.6 oz (93.7 kg)   SpO2 97%   BMI 34.92 kg/m   BP Readings from Last 3 Encounters:  10/05/17 (!) 148/77  09/04/17 (!) 162/102  01/01/17 116/80    Physical Exam  Constitutional: He is oriented to person, place, and time. He appears well-developed and well-nourished.  Cardiovascular: Normal rate, regular rhythm and intact distal pulses. Exam reveals no gallop and no friction rub.  No murmur heard. Pulmonary/Chest: No respiratory distress. He has no wheezes. He has no rales.  Musculoskeletal:       Right elbow: He exhibits decreased range of motion (full flexion, pronation and supination of forearm). He exhibits no effusion, no deformity and no laceration. Tenderness found. Medial epicondyle and lateral epicondyle (worse here) tenderness noted. No radial head and no olecranon process tenderness noted.  Neurological: He is alert and oriented to person, place, and time.    Assessment and Plan :   1. Essential hypertension 2. Elevated blood pressure reading - Maintain HCTZ, amlodipine, add losartan. Follow up in 1-3 months.   3. Lateral epicondylitis of right elbow 4. Right forearm pain - Reviewed management including NSAID, celecoxib or meloxicam. Use elbow strap, rest from repetitive activities involving use of arm.   Jaynee Eagles, PA-C [facility] [phone] 10/05/2017  11:35 AM

## 2017-10-05 NOTE — Patient Instructions (Addendum)
Tennis Elbow Tennis elbow (lateral epicondylitis) is inflammation of the outer tendons of your forearm close to your elbow. Your tendons attach your muscles to your bones. The outer tendons of your forearm are used to extend your wrist, and they attach on the outside part of your elbow. Tennis elbow is often found in people who play tennis, but anyone may get the condition from repeatedly extending the wrist or turning the forearm. What are the causes? This condition is caused by repeatedly extending your wrist and using your hands. It can result from sports or work that requires repetitive forearm movements. Tennis elbow may also be caused by an injury. What increases the risk? You have a higher risk of developing tennis elbow if you play tennis or another racquet sport. You also have a higher risk if you frequently use your hands for work. This condition is also more likely to develop in:  Musicians.  Carpenters, painters, and plumbers.  Cooks.  Cashiers.  People who work in factories.  Construction workers.  Butchers.  People who use computers.  What are the signs or symptoms? Symptoms of this condition include:  Pain and tenderness in your forearm and the outer part of your elbow. You may only feel the pain when you use your arm, or you may feel it even when you are not using your arm.  A burning feeling that runs from your elbow through your arm.  Weak grip in your hands.  How is this diagnosed? This condition may be diagnosed by medical history and physical exam. You may also have other tests, including:  X-rays.  MRI.  How is this treated? Your health care provider will recommend lifestyle adjustments, such as resting and icing your arm. Treatment may also include:  Medicines for inflammation. This may include shots of cortisone if your pain continues.  Physical therapy. This may include massage or exercises.  An elbow brace.  Surgery may eventually be  recommended if your pain does not go away with treatment. Follow these instructions at home: Activity  Rest your elbow and wrist as directed by your health care provider. Try to avoid any activities that caused the problem until your health care provider says that you can do them again.  If a physical therapist teaches you exercises, do all of them as directed.  If you lift an object, lift it with your palm facing upward. This lowers the stress on your elbow. Lifestyle  If your tennis elbow is caused by sports, check your equipment and make sure that: ? You are using it correctly. ? It is the best fit for you.  If your tennis elbow is caused by work, take breaks frequently, if you are able. Talk with your manager about how to best perform tasks in a way that is safe. ? If your tennis elbow is caused by computer use, talk with your manager about any changes that can be made to your work environment. General instructions  If directed, apply ice to the painful area: ? Put ice in a plastic bag. ? Place a towel between your skin and the bag. ? Leave the ice on for 20 minutes, 2-3 times per day.  Take medicines only as directed by your health care provider.  If you were given a brace, wear it as directed by your health care provider.  Keep all follow-up visits as directed by your health care provider. This is important. Contact a health care provider if:  Your pain does not   get better with treatment.  Your pain gets worse.  You have numbness or weakness in your forearm, hand, or fingers. This information is not intended to replace advice given to you by your health care provider. Make sure you discuss any questions you have with your health care provider. Document Released: 11/13/2005 Document Revised: 07/13/2016 Document Reviewed: 11/09/2014 Elsevier Interactive Patient Education  2018 [employer].      Hypertension Hypertension is another name for high blood pressure. High  blood pressure forces your heart to work harder to pump blood. This can cause problems over time. There are two numbers in a blood pressure reading. There is a top number (systolic) over a bottom number (diastolic). It is best to have a blood pressure below 120/80. Healthy choices can help lower your blood pressure. You may need medicine to help lower your blood pressure if:  Your blood pressure cannot be lowered with healthy choices.  Your blood pressure is higher than 130/80.  Follow these instructions at home: Eating and drinking  If directed, follow the DASH eating plan. This diet includes: ? Filling half of your plate at each meal with fruits and vegetables. ? Filling one quarter of your plate at each meal with whole grains. Whole grains include whole wheat pasta, brown rice, and whole grain bread. ? Eating or drinking low-fat dairy products, such as skim milk or low-fat yogurt. ? Filling one quarter of your plate at each meal with low-fat (lean) proteins. Low-fat proteins include fish, skinless chicken, eggs, beans, and tofu. ? Avoiding fatty meat, cured and processed meat, or chicken with skin. ? Avoiding premade or processed food.  Eat less than 1,500 mg of salt (sodium) a day.  Limit alcohol use to no more than 1 drink a day for nonpregnant women and 2 drinks a day for men. One drink equals 12 oz of beer, 5 oz of wine, or 1 oz of hard liquor. Lifestyle  Work with your doctor to stay at a healthy weight or to lose weight. Ask your doctor what the best weight is for you.  Get at least 30 minutes of exercise that causes your heart to beat faster (aerobic exercise) most days of the week. This may include walking, swimming, or biking.  Get at least 30 minutes of exercise that strengthens your muscles (resistance exercise) at least 3 days a week. This may include lifting weights or pilates.  Do not use any products that contain nicotine or tobacco. This includes cigarettes and  e-cigarettes. If you need help quitting, ask your doctor.  Check your blood pressure at home as told by your doctor.  Keep all follow-up visits as told by your doctor. This is important. Medicines  Take over-the-counter and prescription medicines only as told by your doctor. Follow directions carefully.  Do not skip doses of blood pressure medicine. The medicine does not work as well if you skip doses. Skipping doses also puts you at risk for problems.  Ask your doctor about side effects or reactions to medicines that you should watch for. Contact a doctor if:  You think you are having a reaction to the medicine you are taking.  You have headaches that keep coming back (recurring).  You feel dizzy.  You have swelling in your ankles.  You have trouble with your vision. Get help right away if:  You get a very bad headache.  You start to feel confused.  You feel weak or numb.  You feel faint.  You get  very bad pain in your: ? Chest. ? Belly (abdomen).  You throw up (vomit) more than once.  You have trouble breathing. Summary  Hypertension is another name for high blood pressure.  Making healthy choices can help lower blood pressure. If your blood pressure cannot be controlled with healthy choices, you may need to take medicine. This information is not intended to replace advice given to you by your health care provider. Make sure you discuss any questions you have with your health care provider. Document Released: 05/01/2008 Document Revised: 10/11/2016 Document Reviewed: 10/11/2016 Elsevier Interactive Patient Education  2018 [employer].     IF you received an x-ray today, you will receive an invoice from [facility]. Please contact [facility] at [phone] with questions or concerns regarding your invoice.   IF you received labwork today, you will receive an invoice from [facility]. Please contact [facility] at [phone] with questions or  concerns regarding your invoice.   Our billing staff will not be able to assist you with questions regarding bills from these companies.  You will be contacted with the lab results as soon as they are available. The fastest way to get your results is to activate your My Chart account. Instructions are located on the last page of this paperwork. If you have not heard from [place] regarding the results in 2 weeks, please contact this office.

## 2018-01-03 ENCOUNTER — Ambulatory Visit (INDEPENDENT_AMBULATORY_CARE_PROVIDER_SITE_OTHER): Payer: BLUE CROSS/BLUE SHIELD | Admitting: Urgent Care

## 2018-01-03 ENCOUNTER — Encounter: Payer: Self-pay | Admitting: Urgent Care

## 2018-01-03 VITALS — BP 144/90 | HR 85 | Temp 98.3°F | Resp 18 | Ht 64.5 in | Wt 207.4 lb

## 2018-01-03 DIAGNOSIS — I10 Essential (primary) hypertension: Secondary | ICD-10-CM

## 2018-01-03 DIAGNOSIS — R030 Elevated blood-pressure reading, without diagnosis of hypertension: Secondary | ICD-10-CM

## 2018-01-03 DIAGNOSIS — E669 Obesity, unspecified: Secondary | ICD-10-CM | POA: Diagnosis not present

## 2018-01-03 DIAGNOSIS — I119 Hypertensive heart disease without heart failure: Secondary | ICD-10-CM | POA: Diagnosis not present

## 2018-01-03 MED ORDER — LOSARTAN POTASSIUM 100 MG PO TABS: 100.0000 mg | ORAL_TABLET | Freq: Every day | ORAL | 1 refills | Status: DC

## 2018-01-03 NOTE — Progress Notes (Signed)
   MRN: [id number] DOB: October 21, 1966  Subjective:   Eric Obrien is a 52 y.o. male presenting for follow up on Hypertension. Currently managed with amlodipine, HCTZ, losartan. Patient is not checking blood pressure at home. Diet is not compliant but he is cutting back on eating restaurant food and trying to avoid salt, is not exercising. Denies dizziness, chronic headache, chest pain, shortness of breath, heart racing, palpitations, nausea, vomiting, abdominal pain, hematuria, lower leg swelling. Denies smoking cigarettes. He did drink heavily recently while he was traveling. Sleeps very late, gets about 4 hours of sleep nightly. Admits that he is under stress from working full time, going to school too.  Eric Obrien has a current medication list which includes the following prescription(s): amlodipine, atorvastatin, celecoxib, hydrochlorothiazide, losartan, meloxicam, and multivitamin with minerals. Also has No Known Allergies.  Eric Obrien  has a past medical history of Hypertension. Denies past surgical history.   Objective:   Vitals: BP (!) 144/90   Pulse 85   Temp 98.3 F (36.8 C) (Oral)   Resp 18   Ht 5' 4.5" (1.638 m)   Wt 207 lb 6.4 oz (94.1 kg)   SpO2 100%   BMI 35.05 kg/m   BP Readings from Last 3 Encounters:  01/03/18 (!) 144/90  10/05/17 (!) 148/77  09/04/17 (!) 162/102   Physical Exam  Constitutional: He is oriented to person, place, and time. He appears well-developed and well-nourished.  Cardiovascular: Normal rate, regular rhythm and intact distal pulses. Exam reveals no gallop and no friction rub.  No murmur heard. Pulmonary/Chest: No respiratory distress. He has no wheezes. He has no rales.  Neurological: He is alert and oriented to person, place, and time.  Skin: Skin is warm and dry.  Psychiatric: He has a normal mood and affect.   Assessment and Plan :   Essential hypertension  Elevated blood pressure reading  Hypertensive heart disease without CHF  Obesity (BMI  30-39.9)  Will increase losartan to 100mg , counseled patient on importance of taking care of himself including healthy diet, exercise, adequate sleep. Will follow up in 1-3 months.    Jaynee Eagles, PA-C [facility] at [facility] [phone] 01/03/2018  11:07 AM

## 2018-01-03 NOTE — Patient Instructions (Addendum)
For seasoning use salt free mixes, [name] is a very good option.   Salads - kale, spinach, cabbage, spring mix; use seeds like pumpkin seeds or sunflower seeds; you can also use 1-2 hard boiled eggs. Fruits - avocadoes, berries (blueberries, raspberries, blackberries), apples, oranges, pomegranate, grapefruit Seeds - quinoa, chia seeds; you can also incorporate oatmeal Vegetables - aspargus, cauliflower, broccoli, green beans, brussel spouts, bell peppers; stay away from starchy vegetables like potatoes, carrots, peas  Brussel sprouts - Cut off stems. Place in a mixing bowl that has a lid. Pour in a 1/4-1/2 cup olive oil, spices, use a light amount of parmesan. Place on a baking sheet. Bake for 10 minutes at 400F. Take it out, eat the brussel chips. Place for another 5-10 minutes.   Mashed cauliflower - Boil a bunch of cauliflower in a pot of water. Blend in a food processor with 1-2 tablespoons of butter.  Spaghetti squash -  Cut the squash in half very carefully, clean out seeds from the middle. Place 1/2 face down in a microwave safe dish with at least 2 inches of water. Make 4-6 slits on outside of spaghetti squash and microwave for 10-12 minutes. Take out the spaghetti using a metal spoon. Repeat for the other half.   Vega protein is good protein powder, make sure you use ~6 ice cubes to give it smoothie consistency together with ~4-6 ounces of vanilla soy milk. Throw cinnamon into your shake, use peanut butter. You can also use the fruits that I listed above. Throw spinach or kale into the shake.      Hypertension Hypertension is another name for high blood pressure. High blood pressure forces your heart to work harder to pump blood. This can cause problems over time. There are two numbers in a blood pressure reading. There is a top number (systolic) over a bottom number (diastolic). It is best to have a blood pressure below 120/80. Healthy choices can help lower your blood pressure.  You may need medicine to help lower your blood pressure if:  Your blood pressure cannot be lowered with healthy choices.  Your blood pressure is higher than 130/80.  Follow these instructions at home: Eating and drinking  If directed, follow the DASH eating plan. This diet includes: ? Filling half of your plate at each meal with fruits and vegetables. ? Filling one quarter of your plate at each meal with whole grains. Whole grains include whole wheat pasta, brown rice, and whole grain bread. ? Eating or drinking low-fat dairy products, such as skim milk or low-fat yogurt. ? Filling one quarter of your plate at each meal with low-fat (lean) proteins. Low-fat proteins include fish, skinless chicken, eggs, beans, and tofu. ? Avoiding fatty meat, cured and processed meat, or chicken with skin. ? Avoiding premade or processed food.  Eat less than 1,500 mg of salt (sodium) a day.  Limit alcohol use to no more than 1 drink a day for nonpregnant women and 2 drinks a day for men. One drink equals 12 oz of beer, 5 oz of wine, or 1 oz of hard liquor. Lifestyle  Work with your doctor to stay at a healthy weight or to lose weight. Ask your doctor what the best weight is for you.  Get at least 30 minutes of exercise that causes your heart to beat faster (aerobic exercise) most days of the week. This may include walking, swimming, or biking.  Get at least 30 minutes of exercise that strengthens your  muscles (resistance exercise) at least 3 days a week. This may include lifting weights or pilates.  Do not use any products that contain nicotine or tobacco. This includes cigarettes and e-cigarettes. If you need help quitting, ask your doctor.  Check your blood pressure at home as told by your doctor.  Keep all follow-up visits as told by your doctor. This is important. Medicines  Take over-the-counter and prescription medicines only as told by your doctor. Follow directions carefully.  Do not  skip doses of blood pressure medicine. The medicine does not work as well if you skip doses. Skipping doses also puts you at risk for problems.  Ask your doctor about side effects or reactions to medicines that you should watch for. Contact a doctor if:  You think you are having a reaction to the medicine you are taking.  You have headaches that keep coming back (recurring).  You feel dizzy.  You have swelling in your ankles.  You have trouble with your vision. Get help right away if:  You get a very bad headache.  You start to feel confused.  You feel weak or numb.  You feel faint.  You get very bad pain in your: ? Chest. ? Belly (abdomen).  You throw up (vomit) more than once.  You have trouble breathing. Summary  Hypertension is another name for high blood pressure.  Making healthy choices can help lower blood pressure. If your blood pressure cannot be controlled with healthy choices, you may need to take medicine. This information is not intended to replace advice given to you by your health care provider. Make sure you discuss any questions you have with your health care provider. Document Released: 05/01/2008 Document Revised: 10/11/2016 Document Reviewed: 10/11/2016 Elsevier Interactive Patient Education  2018 [employer].     IF you received an x-ray today, you will receive an invoice from [facility] Rad[facility]. Please contact [facility] at [phone] with questions or concerns regarding your invoice.   IF you received labwork today, you will receive an invoice from [facility]. Please contact [facility] at [phone] with questions or concerns regarding your invoice.   Our billing staff will not be able to assist you with questions regarding bills from these companies.  You will be contacted with the lab results as soon as they are available. The fastest way to get your results is to activate your My Chart account. Instructions are located on the  last page of this paperwork. If you have not heard from [place] regarding the results in 2 weeks, please contact this office.

## 2018-01-09 ENCOUNTER — Other Ambulatory Visit (INDEPENDENT_AMBULATORY_CARE_PROVIDER_SITE_OTHER): Payer: BLUE CROSS/BLUE SHIELD | Admitting: *Deleted

## 2018-01-09 ENCOUNTER — Ambulatory Visit (INDEPENDENT_AMBULATORY_CARE_PROVIDER_SITE_OTHER): Payer: BLUE CROSS/BLUE SHIELD | Admitting: Physician Assistant

## 2018-01-09 DIAGNOSIS — Z23 Encounter for immunization: Secondary | ICD-10-CM

## 2018-01-09 NOTE — Progress Notes (Signed)
  Tuberculosis Risk Questionnaire  1. Yes [place] Were you born outside the [place] in one of the following parts of the world: [place], [place], [place], [place] or [place]?    2. No Have you traveled outside the [place] and lived for more than one month in one of the following parts of the world: [place], [place], [place], [place] or [place]?    3. No Do you have a compromised immune system such as from any of the following conditions:HIV/AIDS, organ or bone marrow transplantation, diabetes, immunosuppressive medicines (e.g. Prednisone, Remicaide), leukemia, lymphoma, cancer of the head or neck, gastrectomy or jejunal bypass, end-stage renal disease (on dialysis), or silicosis?     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    5. No Have you ever: injected illegal drugs, used crack cocaine, lived in a homeless shelter  or been in jail or prison?     6. No Have you ever been exposed to anyone with infectious tuberculosis?  7. No Have you ev er had a BCG vaccine? (BCG is a vaccine for tuberculosis  (TB) used in OTHER countries, NOT in the [place]).  8. No Have you ever been advised by a health care provider NOT to have a TB skin test?  9. No Have you ever had a POSITIVE TB skin test?  IF SO, when? n/a  IF SO, were you treated with INH? n/a  IF SO, where? n/a  Tuberculosis Symptom Questionnaire  Do you currently have any of the following symptoms?  1. No Unexplained cough lasting more than 3 weeks?   2. No Unexplained fever lasting more than 3 weeks.   3. No Night Sweats (sweating that leaves the bedclothes and sheets wet)     4. No Shortness of Breath   5. No Chest Pain   6. No Unintentional weight loss    7. No Unexplained fatigue (very tired for no reason)

## 2018-01-11 ENCOUNTER — Ambulatory Visit (INDEPENDENT_AMBULATORY_CARE_PROVIDER_SITE_OTHER): Payer: BLUE CROSS/BLUE SHIELD

## 2018-01-11 DIAGNOSIS — Z111 Encounter for screening for respiratory tuberculosis: Secondary | ICD-10-CM

## 2018-01-11 LAB — TB SKIN TEST
Induration: 0 mm
TB Skin Test: NEGATIVE

## 2018-01-11 NOTE — Progress Notes (Signed)
PPD Reading Note PPD read and results entered in [place]. Result: 0 mm induration. Interpretation: negative If test not read within 48-72 hours of initial placement, patient advised to repeat in other arm 1-3 weeks after this test. Allergic reaction: redness noted at site of TB placement.

## 2018-01-28 DIAGNOSIS — H40013 Open angle with borderline findings, low risk, bilateral: Secondary | ICD-10-CM | POA: Diagnosis not present

## 2018-01-28 DIAGNOSIS — H01024 Squamous blepharitis left upper eyelid: Secondary | ICD-10-CM | POA: Diagnosis not present

## 2018-01-28 DIAGNOSIS — H01022 Squamous blepharitis right lower eyelid: Secondary | ICD-10-CM | POA: Diagnosis not present

## 2018-01-28 DIAGNOSIS — H01021 Squamous blepharitis right upper eyelid: Secondary | ICD-10-CM | POA: Diagnosis not present

## 2018-02-14 DIAGNOSIS — Z23 Encounter for immunization: Secondary | ICD-10-CM

## 2018-03-06 ENCOUNTER — Other Ambulatory Visit: Payer: Self-pay | Admitting: Urgent Care

## 2018-03-06 DIAGNOSIS — R0981 Nasal congestion: Secondary | ICD-10-CM

## 2018-03-06 NOTE — Telephone Encounter (Signed)
Cannot locate on med list or when ordered.

## 2018-04-02 ENCOUNTER — Ambulatory Visit: Payer: BLUE CROSS/BLUE SHIELD | Admitting: Urgent Care

## 2018-04-02 ENCOUNTER — Encounter: Payer: Self-pay | Admitting: Urgent Care

## 2018-04-02 VITALS — BP 163/84 | HR 71 | Temp 98.5°F | Resp 17 | Ht 64.5 in | Wt 211.0 lb

## 2018-04-02 DIAGNOSIS — R030 Elevated blood-pressure reading, without diagnosis of hypertension: Secondary | ICD-10-CM | POA: Diagnosis not present

## 2018-04-02 DIAGNOSIS — I10 Essential (primary) hypertension: Secondary | ICD-10-CM

## 2018-04-02 DIAGNOSIS — I119 Hypertensive heart disease without heart failure: Secondary | ICD-10-CM

## 2018-04-02 DIAGNOSIS — E669 Obesity, unspecified: Secondary | ICD-10-CM

## 2018-04-02 DIAGNOSIS — E782 Mixed hyperlipidemia: Secondary | ICD-10-CM | POA: Diagnosis not present

## 2018-04-02 MED ORDER — HYDROCHLOROTHIAZIDE 25 MG PO TABS: 25.0000 mg | ORAL_TABLET | Freq: Every day | ORAL | 1 refills | Status: DC

## 2018-04-02 MED ORDER — ATORVASTATIN CALCIUM 40 MG PO TABS: 40.0000 mg | ORAL_TABLET | Freq: Every day | ORAL | 3 refills | Status: DC

## 2018-04-02 MED ORDER — AMLODIPINE BESYLATE 10 MG PO TABS: 10.0000 mg | ORAL_TABLET | Freq: Every day | ORAL | 1 refills | Status: DC

## 2018-04-02 MED ORDER — METOPROLOL TARTRATE 25 MG PO TABS: 25.0000 mg | ORAL_TABLET | Freq: Two times a day (BID) | ORAL | 1 refills | Status: DC

## 2018-04-02 NOTE — Progress Notes (Signed)
    MRN: [id number] DOB: December 24, 1965  Subjective:   Eric Obrien is a 52 y.o. male presenting for follow up on Hypertension.  Currently managed with losartan, HCTZ, amlodipine.  He has not taken his medication in 4 weeks, ran out.  His BP at home is 824'M systolic. Avoids salt in diet, is exercising.  Reports that his diet is very compliant, avoids all salt and minimizes sodium in his foods, does not eat restaurant food.   Reports that he does not get enough sleep daily, sleeps about 4 to 5 hours/day.  Works full time, goes to school at night, 5:30pm-10:00pm.  Denies dizziness, chronic headache, blurred vision, chest pain, shortness of breath, heart racing, palpitations, nausea, vomiting, abdominal pain, hematuria, lower leg swelling.  Denies smoking cigarettes. Has occasional alcohol drink.  Eric Obrien is not currently taking any medications due to running out. Also has No Known Allergies.  Zailen  has a past medical history of Hypertension. Also  has no past surgical history on file.  Objective:   Vitals: BP (!) 163/84   Pulse 71   Temp 98.5 F (36.9 C) (Oral)   Resp 17   Ht 5' 4.5" (1.638 m)   Wt 211 lb (95.7 kg)   SpO2 98%   BMI 35.66 kg/m   BP Readings from Last 3 Encounters:  04/02/18 (!) 163/84  01/03/18 (!) 144/90  10/05/17 (!) 148/77    Wt Readings from Last 3 Encounters:  04/02/18 211 lb (95.7 kg)  01/03/18 207 lb 6.4 oz (94.1 kg)  10/05/17 206 lb 9.6 oz (93.7 kg)    Physical Exam  Constitutional: He is oriented to person, place, and time. He appears well-developed and well-nourished.  HENT:  Mouth/Throat: Oropharynx is clear and moist.  Cardiovascular: Normal rate, regular rhythm and intact distal pulses. Exam reveals no gallop and no friction rub.  No murmur heard. Pulmonary/Chest: No stridor. No respiratory distress. He has no wheezes. He has no rales.  Neurological: He is alert and oriented to person, place, and time.  Skin: Skin is warm and dry.  Psychiatric: He has a  normal mood and affect.   Assessment and Plan :   Essential hypertension - Plan: amLODipine (NORVASC) 10 MG tablet, hydrochlorothiazide (HYDRODIURIL) 25 MG tablet  Elevated blood pressure reading  Hypertensive heart disease without CHF  Obesity (BMI 30-39.9)  Mixed hyperlipidemia  I refilled his amlodipine and hydrochlorothiazide, discontinued his losartan due to the recall.  Also refilled his atorvastatin.  I will have patient start metoprolol as well.  I recommended patient maintain his healthy lifestyle.  I suspect that the lack of sleep and stress from being a full-time student and a full-time employee is playing a role with his persistently elevated blood pressure.  I am going to do some research regarding ARB's to see which one he is able to take that is not currently recalled.  Patient is to follow-up in 4 weeks.  Jaynee Eagles, PA-C [facility] [phone] 04/02/2018  11:38 AM

## 2018-04-02 NOTE — Patient Instructions (Addendum)
Hypertension Hypertension, commonly called high blood pressure, is when the force of blood pumping through the arteries is too strong. The arteries are the blood vessels that carry blood from the heart throughout the body. Hypertension forces the heart to work harder to pump blood and may cause arteries to become narrow or stiff. Having untreated or uncontrolled hypertension can cause heart attacks, strokes, kidney disease, and other problems. A blood pressure reading consists of a higher number over a lower number. Ideally, your blood pressure should be below 120/80. The first ("top") number is called the systolic pressure. It is a measure of the pressure in your arteries as your heart beats. The second ("bottom") number is called the diastolic pressure. It is a measure of the pressure in your arteries as the heart relaxes. What are the causes? The cause of this condition is not known. What increases the risk? Some risk factors for high blood pressure are under your control. Others are not. Factors you can change  Smoking.  Having type 2 diabetes mellitus, high cholesterol, or both.  Not getting enough exercise or physical activity.  Being overweight.  Having too much fat, sugar, calories, or salt (sodium) in your diet.  Drinking too much alcohol. Factors that are difficult or impossible to change  Having chronic kidney disease.  Having a family history of high blood pressure.  Age. Risk increases with age.  Race. You may be at higher risk if you are African-American.  Gender. Men are at higher risk than women before age 45. After age 65, women are at higher risk than men.  Having obstructive sleep apnea.  Stress. What are the signs or symptoms? Extremely high blood pressure (hypertensive crisis) may cause:  Headache.  Anxiety.  Shortness of breath.  Nosebleed.  Nausea and vomiting.  Severe chest pain.  Jerky movements you cannot control (seizures).  How is this  diagnosed? This condition is diagnosed by measuring your blood pressure while you are seated, with your arm resting on a surface. The cuff of the blood pressure monitor will be placed directly against the skin of your upper arm at the level of your heart. It should be measured at least twice using the same arm. Certain conditions can cause a difference in blood pressure between your right and left arms. Certain factors can cause blood pressure readings to be lower or higher than normal (elevated) for a short period of time:  When your blood pressure is higher when you are in a health care provider's office than when you are at home, this is called white coat hypertension. Most people with this condition do not need medicines.  When your blood pressure is higher at home than when you are in a health care provider's office, this is called masked hypertension. Most people with this condition may need medicines to control blood pressure.  If you have a high blood pressure reading during one visit or you have normal blood pressure with other risk factors:  You may be asked to return on a different day to have your blood pressure checked again.  You may be asked to monitor your blood pressure at home for 1 week or longer.  If you are diagnosed with hypertension, you may have other blood or imaging tests to help your health care provider understand your overall risk for other conditions. How is this treated? This condition is treated by making healthy lifestyle changes, such as eating healthy foods, exercising more, and reducing your alcohol intake. Your   health care provider may prescribe medicine if lifestyle changes are not enough to get your blood pressure under control, and if:  Your systolic blood pressure is above 130.  Your diastolic blood pressure is above 80.  Your personal target blood pressure may vary depending on your medical conditions, your age, and other factors. Follow these  instructions at home: Eating and drinking  Eat a diet that is high in fiber and potassium, and low in sodium, added sugar, and fat. An example eating plan is called the DASH (Dietary Approaches to Stop Hypertension) diet. To eat this way: ? Eat plenty of fresh fruits and vegetables. Try to fill half of your plate at each meal with fruits and vegetables. ? Eat whole grains, such as whole wheat pasta, brown rice, or whole grain bread. Fill about one quarter of your plate with whole grains. ? Eat or drink low-fat dairy products, such as skim milk or low-fat yogurt. ? Avoid fatty cuts of meat, processed or cured meats, and poultry with skin. Fill about one quarter of your plate with lean proteins, such as fish, chicken without skin, beans, eggs, and tofu. ? Avoid premade and processed foods. These tend to be higher in sodium, added sugar, and fat.  Reduce your daily sodium intake. Most people with hypertension should eat less than 1,500 mg of sodium a day.  Limit alcohol intake to no more than 1 drink a day for nonpregnant women and 2 drinks a day for men. One drink equals 12 oz of beer, 5 oz of wine, or 1 oz of hard liquor. Lifestyle  Work with your health care provider to maintain a healthy body weight or to lose weight. Ask what an ideal weight is for you.  Get at least 30 minutes of exercise that causes your heart to beat faster (aerobic exercise) most days of the week. Activities may include walking, swimming, or biking.  Include exercise to strengthen your muscles (resistance exercise), such as pilates or lifting weights, as part of your weekly exercise routine. Try to do these types of exercises for 30 minutes at least 3 days a week.  Do not use any products that contain nicotine or tobacco, such as cigarettes and e-cigarettes. If you need help quitting, ask your health care provider.  Monitor your blood pressure at home as told by your health care provider.  Keep all follow-up visits as  told by your health care provider. This is important. Medicines  Take over-the-counter and prescription medicines only as told by your health care provider. Follow directions carefully. Blood pressure medicines must be taken as prescribed.  Do not skip doses of blood pressure medicine. Doing this puts you at risk for problems and can make the medicine less effective.  Ask your health care provider about side effects or reactions to medicines that you should watch for. Contact a health care provider if:  You think you are having a reaction to a medicine you are taking.  You have headaches that keep coming back (recurring).  You feel dizzy.  You have swelling in your ankles.  You have trouble with your vision. Get help right away if:  You develop a severe headache or confusion.  You have unusual weakness or numbness.  You feel faint.  You have severe pain in your chest or abdomen.  You vomit repeatedly.  You have trouble breathing. Summary  Hypertension is when the force of blood pumping through your arteries is too strong. If this condition is not   controlled, it may put you at risk for serious complications.  Your personal target blood pressure may vary depending on your medical conditions, your age, and other factors. For most people, a normal blood pressure is less than 120/80.  Hypertension is treated with lifestyle changes, medicines, or a combination of both. Lifestyle changes include weight loss, eating a healthy, low-sodium diet, exercising more, and limiting alcohol. This information is not intended to replace advice given to you by your health care provider. Make sure you discuss any questions you have with your health care provider. Document Released: 11/13/2005 Document Revised: 10/11/2016 Document Reviewed: 10/11/2016 Elsevier Interactive Patient Education  2018 Elsevier Inc.     IF you received an x-ray today, you will receive an invoice from [facility]. Please contact [facility] at [phone] with questions or concerns regarding your invoice.   IF you received labwork today, you will receive an invoice from [facility]. Please contact [facility] at [phone] with questions or concerns regarding your invoice.   Our billing staff will not be able to assist you with questions regarding bills from these companies.  You will be contacted with the lab results as soon as they are available. The fastest way to get your results is to activate your My Chart account. Instructions are located on the last page of this paperwork. If you have not heard from us regarding the results in 2 weeks, please contact this office.     

## 2018-04-30 ENCOUNTER — Ambulatory Visit: Payer: BLUE CROSS/BLUE SHIELD | Admitting: Urgent Care

## 2018-05-22 ENCOUNTER — Ambulatory Visit: Payer: BLUE CROSS/BLUE SHIELD | Admitting: Physician Assistant

## 2018-05-22 ENCOUNTER — Other Ambulatory Visit: Payer: Self-pay

## 2018-05-22 ENCOUNTER — Encounter: Payer: Self-pay | Admitting: Physician Assistant

## 2018-05-22 ENCOUNTER — Ambulatory Visit: Payer: Self-pay | Admitting: Physician Assistant

## 2018-05-22 ENCOUNTER — Ambulatory Visit: Payer: Self-pay | Admitting: *Deleted

## 2018-05-22 VITALS — BP 154/96 | Temp 98.4°F | Ht 64.5 in | Wt 210.4 lb

## 2018-05-22 DIAGNOSIS — I10 Essential (primary) hypertension: Secondary | ICD-10-CM | POA: Diagnosis not present

## 2018-05-22 DIAGNOSIS — H8110 Benign paroxysmal vertigo, unspecified ear: Secondary | ICD-10-CM | POA: Diagnosis not present

## 2018-05-22 MED ORDER — MECLIZINE HCL 25 MG PO TABS: 25.0000 mg | ORAL_TABLET | Freq: Three times a day (TID) | ORAL | 1 refills | Status: DC | PRN

## 2018-05-22 MED ORDER — OLMESARTAN MEDOXOMIL 5 MG PO TABS: 5.0000 mg | ORAL_TABLET | Freq: Every day | ORAL | 0 refills | Status: DC

## 2018-05-22 NOTE — Telephone Encounter (Signed)
Pt reports vertigo "Room spins" onset yesterday, mild. Worsening this am, mild at rest ,moderate with ambulation, able to walk unassisted.  States B/P yesterday 151/ 90's. Unable to check presently as home monitor not accessable. Denies headache, SOB, CP, no diaphoresis, no cold symptoms, congestion.  States "Just feel weak this am." States is staying hydrated. Not positional. Appt made for today with W. McVey.  States family will drive. Care advise given per protocol; pt verbalizes understanding.  Reason for Disposition . [1] MODERATE dizziness (e.g., vertigo; feels very unsteady, interferes with normal activities) AND [2] has NOT been evaluated by physician for this  Answer Assessment - Initial Assessment Questions 1. DESCRIPTION: "Describe your dizziness."     Spinning at times 2. VERTIGO: "Do you feel like either you or the room is spinning or tilting?"      yes 3. LIGHTHEADED: "Do you feel lightheaded?" (e.g., somewhat faint, woozy, weak upon standing)    yes 4. SEVERITY: "How bad is it?"  "Can you walk?"   - MILD - Feels unsteady but walking normally.   - MODERATE - Feels very unsteady when walking, but not falling; interferes with normal activities (e.g., school, work) .   - SEVERE - Unable to walk without falling (requires assistance).     Mild- moderate; mild at rest, moderate with walking. Walks unassisted 5. ONSET:  "When did the dizziness begin?"     Yesterday 6. AGGRAVATING FACTORS: "Does anything make it worse?" (e.g., standing, change in head position)     No 7. CAUSE: "What do you think is causing the dizziness?"     unsure 8. RECURRENT SYMPTOM: "Have you had dizziness before?" If so, ask: "When was the last time?" "What happened that time?"     no 9. OTHER SYMPTOMS: "Do you have any other symptoms?" (e.g., headache, weakness, numbness, vomiting, earache)     Weakness  Protocols used: DIZZINESS - VERTIGO-A-AH

## 2018-05-22 NOTE — Patient Instructions (Addendum)
  Take the meclizine 3 times a day for at least 4-5 days.   Start the new BP medication in addition to your other meds.    IF you received an x-ray today, you will receive an invoice from [facility]. Please contact [facility] at [phone] with questions or concerns regarding your invoice.   IF you received labwork today, you will receive an invoice from [facility]. Please contact [facility] at [phone] with questions or concerns regarding your invoice.   Our billing staff will not be able to assist you with questions regarding bills from these companies.  You will be contacted with the lab results as soon as they are available. The fastest way to get your results is to activate your My Chart account. Instructions are located on the last page of this paperwork. If you have not heard from [place] regarding the results in 2 weeks, please contact this office.

## 2018-05-22 NOTE — Progress Notes (Signed)
05/22/2018 4:44 PM   DOB: 1966-09-02 / MRN: [id number]  SUBJECTIVE:  Eric Obrien is a 52 y.o. male presenting for a spinning sensation that occurred for 2 minutes this morning when he got up from bed.  Reports that it went away after this.  He denies any weakness occurring with the dizziness.  He associates some mild nauseousness but did not have any emesis.  Denies any loss of consciousness or confusion.  He tells me that he feels well right now.  He is working closely with [place] here at primary care [facility] with regard to his blood pressure.  He did check his blood pressure this morning during the dizzy episode and tells me it was 120/78.  He was advised at his last appointment to return after about 1 month from medication titration however he was unable to make that appointment.  He is willing to schedule that today.  He has No Known Allergies.   He  has a past medical history of Hypertension.    He  reports that he has never smoked. He has never used smokeless tobacco. He reports that he does not drink alcohol or use drugs. He  reports that he does not engage in sexual activity. The patient  has no past surgical history on file.  His family history is not on file.  Review of Systems  Constitutional: Negative for fever.  Respiratory: Negative for cough and shortness of breath.   Cardiovascular: Negative for chest pain.  Gastrointestinal: Positive for nausea (Resolved at present).  Genitourinary: Negative for dysuria.  Musculoskeletal: Negative for back pain, falls, joint pain, myalgias and neck pain.  Neurological: Positive for dizziness (Resolved at present). Negative for tingling, tremors, sensory change, speech change, focal weakness, seizures, loss of consciousness, weakness and headaches.  Psychiatric/Behavioral: Negative for depression.    The problem list and medications were reviewed and updated by myself where necessary and exist elsewhere in the encounter.    OBJECTIVE:  Temp 98.4 F (36.9 C) (Oral)   Ht 5' 4.5" (1.638 m)   Wt 210 lb 6.4 oz (95.4 kg)   SpO2 98%   BMI 35.56 kg/m   Wt Readings from Last 3 Encounters:  05/22/18 210 lb 6.4 oz (95.4 kg)  04/02/18 211 lb (95.7 kg)  01/03/18 207 lb 6.4 oz (94.1 kg)   Temp Readings from Last 3 Encounters:  05/22/18 98.4 F (36.9 C) (Oral)  04/02/18 98.5 F (36.9 C) (Oral)  01/03/18 98.3 F (36.8 C) (Oral)   BP Readings from Last 3 Encounters:  04/02/18 (!) 163/84  01/03/18 (!) 144/90  10/05/17 (!) 148/77   Pulse Readings from Last 3 Encounters:  04/02/18 71  01/03/18 85  10/05/17 79    Physical Exam  Constitutional: He is oriented to person, place, and time. He appears well-developed. He does not appear ill.  Eyes: Pupils are equal, round, and reactive to light. Conjunctivae and EOM are normal.  Cardiovascular: Normal rate, regular rhythm, S1 normal, S2 normal, normal heart sounds, intact distal pulses and normal pulses. Exam reveals no gallop and no friction rub.  No murmur heard. Pulmonary/Chest: Effort normal. He has no rales.  Abdominal: He exhibits no distension.  Musculoskeletal: Normal range of motion. He exhibits no edema.  Neurological: He is alert and oriented to person, place, and time. He has normal strength. He displays no atrophy, no tremor and normal reflexes. No sensory deficit. He exhibits normal muscle tone. He displays a negative Romberg sign. He displays  no seizure activity. Coordination and gait normal. GCS eye subscore is 4. GCS verbal subscore is 5. GCS motor subscore is 6.  Negative finger-to-nose.  Tandem walking intact to challenge.  Patient with normal gait.  Cranial nerves intact to challenge.  Skin: Skin is warm and dry. He is not diaphoretic.  Psychiatric: He has a normal mood and affect.  Nursing note and vitals reviewed.   No results found for: HGBA1C  Lab Results  Component Value Date   WBC 8.5 10/07/2015   HGB 14.1 10/07/2015   HCT 42.4  (A) 10/07/2015   MCV 83.5 10/07/2015   PLT 282 04/22/2013    Lab Results  Component Value Date   CREATININE 1.06 09/04/2017   BUN 12 09/04/2017   NA 140 09/04/2017   K 3.9 09/04/2017   CL 99 09/04/2017   CO2 26 09/04/2017    Lab Results  Component Value Date   ALT 30 09/04/2017   AST 28 09/04/2017   ALKPHOS 71 09/04/2017   BILITOT <0.2 09/04/2017    No results found for: TSH  Lab Results  Component Value Date   CHOL 260 (H) 09/04/2017   HDL 34 (L) 09/04/2017   LDLCALC 173 (H) 09/04/2017   TRIG 267 (H) 09/04/2017   CHOLHDL 7.6 (H) 09/04/2017     ASSESSMENT AND PLAN:  Rage was seen today for dizziness.  Diagnoses and all orders for this visit:  Benign paroxysmal positional vertigo, unspecified laterality: HPI and negative neurological exam point towards vertiginous etiology.  I am treating him for that today.  Advised to take medication for the next 3 to 4 days with a symptomatic or not. -     meclizine (ANTIVERT) 25 MG tablet; Take 1 tablet (25 mg total) by mouth 3 (three) times daily as needed for dizziness.  Essential hypertension: Per chart review it appears that PA [place] 1 of the patient on ACE/arm.  I am starting the patient on low-dose of Benicar today.    I have asked the patient to follow-up wit with his regular provider h as soon as possible for blood pressure titration. -     olmesartan (BENICAR) 5 MG tablet; Take 1 tablet (5 mg total) by mouth daily.    The patient is advised to call or return to clinic if he does not see an improvement in symptoms, or to seek the care of the closest emergency department if he worsens with the above plan.   Philis Fendt, MHS, PA-C [facility] 05/22/2018 4:44 PM

## 2018-05-22 NOTE — Progress Notes (Deleted)
  BP Readings from Last 3 Encounters:  04/02/18 (!) 163/84  01/03/18 (!) 144/90  10/05/17 (!) 148/77   CMP Latest Ref Rng & Units 09/04/2017 10/07/2015 06/15/2015  Glucose 65 - 99 mg/dL 86 85 88  BUN 6 - 24 mg/dL 12 14 12   Creatinine 0.76 - 1.27 mg/dL 1.06 1.14 1.03  Sodium 134 - 144 mmol/L 140 138 138  Potassium 3.5 - 5.2 mmol/L 3.9 3.7 3.7  Chloride 96 - 106 mmol/L 99 100 99  CO2 20 - 29 mmol/L 26 26 29   Calcium 8.7 - 10.2 mg/dL 9.8 9.5 9.6  Total Protein 6.0 - 8.5 g/dL 7.3 7.3 7.6  Total Bilirubin 0.0 - 1.2 mg/dL <0.2 0.6 0.5  Alkaline Phos 39 - 117 IU/L 71 73 68  AST 0 - 40 IU/L 28 28 32  ALT 0 - 44 IU/L 30 27 30

## 2018-05-25 ENCOUNTER — Ambulatory Visit: Payer: BLUE CROSS/BLUE SHIELD | Admitting: Urgent Care

## 2018-07-06 ENCOUNTER — Ambulatory Visit: Payer: BLUE CROSS/BLUE SHIELD | Admitting: Urgent Care

## 2018-09-03 ENCOUNTER — Ambulatory Visit: Payer: BLUE CROSS/BLUE SHIELD | Admitting: Physician Assistant

## 2018-09-03 NOTE — Progress Notes (Deleted)
     MRN: [id number] DOB: 04/03/1966  Subjective:   Eric Obrien is a 52 y.o. male presenting for follow up on Hypertension.   Currently managed with ***. Patient {is/are not:32546} checking blood pressure at home, range is *** systolic. Reports ***. Denies lightheadedness, dizziness, chronic headache, double vision, chest pain, shortness of breath, heart racing, palpitations, nausea, vomiting, abdominal pain, hematuria, lower leg swelling. Lifestyle: Avoiding excessive salt intake. Trying to exercise on a regular basis. *** smoking, *** alcohol. Denies any other aggravating or relieving factors, no other questions or concerns.  [name] has a current medication list which includes the following prescription(s): amlodipine, atorvastatin, hydrochlorothiazide, meclizine, metoprolol tartrate, multivitamin with minerals, and olmesartan. Also has No Known Allergies.  [name]  has a past medical history of Hypertension. Also  has no past surgical history on file.   Objective:   Vitals: There were no vitals taken for this visit.  Physical Exam  No results found for this or any previous visit (from the past 24 hour(s)).  Assessment and Plan :  There are no diagnoses linked to this encounter.  Tenna Delaine, PA-C  [facility] 09/03/2018 7:09 AM

## 2018-09-17 ENCOUNTER — Ambulatory Visit: Payer: BLUE CROSS/BLUE SHIELD | Admitting: Physician Assistant

## 2018-09-27 ENCOUNTER — Telehealth: Payer: Self-pay | Admitting: Physician Assistant

## 2018-09-27 ENCOUNTER — Encounter: Payer: Self-pay | Admitting: Physician Assistant

## 2018-09-27 ENCOUNTER — Other Ambulatory Visit: Payer: Self-pay

## 2018-09-27 ENCOUNTER — Ambulatory Visit: Payer: BLUE CROSS/BLUE SHIELD | Admitting: Physician Assistant

## 2018-09-27 VITALS — BP 133/81 | HR 67 | Temp 98.4°F | Resp 16 | Ht 64.0 in | Wt 198.6 lb

## 2018-09-27 DIAGNOSIS — H11002 Unspecified pterygium of left eye: Secondary | ICD-10-CM | POA: Diagnosis not present

## 2018-09-27 DIAGNOSIS — E782 Mixed hyperlipidemia: Secondary | ICD-10-CM

## 2018-09-27 DIAGNOSIS — E669 Obesity, unspecified: Secondary | ICD-10-CM

## 2018-09-27 DIAGNOSIS — I10 Essential (primary) hypertension: Secondary | ICD-10-CM | POA: Diagnosis not present

## 2018-09-27 LAB — POCT URINALYSIS DIP (MANUAL ENTRY)
BILIRUBIN UA: NEGATIVE
BILIRUBIN UA: NEGATIVE mg/dL
Glucose, UA: NEGATIVE mg/dL
Leukocytes, UA: NEGATIVE
NITRITE UA: NEGATIVE
PH UA: 6 (ref 5.0–8.0)
Protein Ur, POC: NEGATIVE mg/dL
Spec Grav, UA: 1.02 (ref 1.010–1.025)
Urobilinogen, UA: 0.2 E.U./dL

## 2018-09-27 LAB — POC MICROSCOPIC URINALYSIS (UMFC): Mucus: ABSENT

## 2018-09-27 MED ORDER — OLMESARTAN MEDOXOMIL 5 MG PO TABS: 5.0000 mg | ORAL_TABLET | Freq: Every day | ORAL | 1 refills | Status: DC

## 2018-09-27 MED ORDER — METOPROLOL TARTRATE 25 MG PO TABS: 25.0000 mg | ORAL_TABLET | Freq: Two times a day (BID) | ORAL | 1 refills | Status: DC

## 2018-09-27 MED ORDER — AMLODIPINE BESYLATE 10 MG PO TABS: 10.0000 mg | ORAL_TABLET | Freq: Every day | ORAL | 1 refills | Status: DC

## 2018-09-27 MED ORDER — HYDROCHLOROTHIAZIDE 25 MG PO TABS: 25.0000 mg | ORAL_TABLET | Freq: Every day | ORAL | 1 refills | Status: DC

## 2018-09-27 NOTE — Patient Instructions (Addendum)
Keep working on diet and exercise! Follow up in 6 months.  Eye doctor should call you in 2 weeks.     If you have lab work done today you will be contacted with your lab results within the next 2 weeks.  If you have not heard from [place] then please contact us. The fastest way to get your results is to register for My Chart.   Hypertension Hypertension is another name for high blood pressure. High blood pressure forces your heart to work harder to pump blood. This can cause problems over time. There are two numbers in a blood pressure reading. There is a top number (systolic) over a bottom number (diastolic). It is best to have a blood pressure below 120/80. Healthy choices can help lower your blood pressure. You may need medicine to help lower your blood pressure if:  Your blood pressure cannot be lowered with healthy choices.  Your blood pressure is higher than 130/80.  Follow these instructions at home: Eating and drinking  If directed, follow the DASH eating plan. This diet includes: ? Filling half of your plate at each meal with fruits and vegetables. ? Filling one quarter of your plate at each meal with whole grains. Whole grains include whole wheat pasta, brown rice, and whole grain bread. ? Eating or drinking low-fat dairy products, such as skim milk or low-fat yogurt. ? Filling one quarter of your plate at each meal with low-fat (lean) proteins. Low-fat proteins include fish, skinless chicken, eggs, beans, and tofu. ? Avoiding fatty meat, cured and processed meat, or chicken with skin. ? Avoiding premade or processed food.  Eat less than 1,500 mg of salt (sodium) a day.  Limit alcohol use to no more than 1 drink a day for nonpregnant women and 2 drinks a day for men. One drink equals 12 oz of beer, 5 oz of wine, or 1 oz of hard liquor. Lifestyle  Work with your doctor to stay at a healthy weight or to lose weight. Ask your doctor what the best weight is for you.  Get at least  30 minutes of exercise that causes your heart to beat faster (aerobic exercise) most days of the week. This may include walking, swimming, or biking.  Get at least 30 minutes of exercise that strengthens your muscles (resistance exercise) at least 3 days a week. This may include lifting weights or pilates.  Do not use any products that contain nicotine or tobacco. This includes cigarettes and e-cigarettes. If you need help quitting, ask your doctor.  Check your blood pressure at home as told by your doctor.  Keep all follow-up visits as told by your doctor. This is important. Medicines  Take over-the-counter and prescription medicines only as told by your doctor. Follow directions carefully.  Do not skip doses of blood pressure medicine. The medicine does not work as well if you skip doses. Skipping doses also puts you at risk for problems.  Ask your doctor about side effects or reactions to medicines that you should watch for. Contact a doctor if:  You think you are having a reaction to the medicine you are taking.  You have headaches that keep coming back (recurring).  You feel dizzy.  You have swelling in your ankles.  You have trouble with your vision. Get help right away if:  You get a very bad headache.  You start to feel confused.  You feel weak or numb.  You feel faint.  You get very bad  pain in your: ? Chest. ? Belly (abdomen).  You throw up (vomit) more than once.  You have trouble breathing. Summary  Hypertension is another name for high blood pressure.  Making healthy choices can help lower blood pressure. If your blood pressure cannot be controlled with healthy choices, you may need to take medicine. This information is not intended to replace advice given to you by your health care provider. Make sure you discuss any questions you have with your health care provider. Document Released: 05/01/2008 Document Revised: 10/11/2016 Document Reviewed:  10/11/2016 Elsevier Interactive Patient Education  2018 [employer].  IF you received an x-ray today, you will receive an invoice from [facility]. Please contact [facility] at [phone] with questions or concerns regarding your invoice.   IF you received labwork today, you will receive an invoice from [place]. Please contact [facility] at [phone] with questions or concerns regarding your invoice.   Our billing staff will not be able to assist you with questions regarding bills from these companies.  You will be contacted with the lab results as soon as they are available. The fastest way to get your results is to activate your My Chart account. Instructions are located on the last page of this paperwork. If you have not heard from [place] regarding the results in 2 weeks, please contact this office.

## 2018-09-27 NOTE — Progress Notes (Signed)
p    MRN: [id number] DOB: [birth year]  Subjective:   Eric Obrien is a 52 y.o. male presenting for follow up for medication refills.  -Hypertension:Currently managed with amlodipine 10 mg daily, HCTZ 12.5 mg daily, Lopressor 25 mg BID, and olmesartan 5 mg mg twice daily, Benicar 5 mg.checks blood pressure occasionally, within normal limits.  Denies lightheadedness, dizziness, chronic headache, double vision, chest pain, shortness of breath, heart racing, palpitations, nausea, vomiting, abdominal pain, hematuria, lower leg swelling. Lifestyle: Has made healthier lifestyle changes. Now eats lots of vegetables. Avoiding excessive salt intake. Trying to exercise on a regular basis, lifting and running 4 days a week. Deneis smoking.  -HLD: Taking Lipitor 40 mg.  Diet as above.  Does not need refills at this time.  Beth has a current medication list which includes the following prescription(s): amlodipine, atorvastatin, hydrochlorothiazide, metoprolol tartrate, multivitamin with minerals, and olmesartan. Also has No Known Allergies.  Eero  has a past medical history of Hypertension. Also  has no past surgical history on file.   Objective:   Vitals: BP 133/81 (BP Location: Right Arm, Patient Position: Sitting, Cuff Size: Normal)   Pulse 67   Temp 98.4 F (36.9 C) (Oral)   Resp 16   Ht 5\' 4"  (1.626 m)   Wt 198 lb 9.6 oz (90.1 kg)   SpO2 97%   BMI 34.09 kg/m   Physical Exam  Constitutional: He is oriented to person, place, and time. He appears well-developed and well-nourished. No distress.  HENT:  Head: Normocephalic and atraumatic.  Mouth/Throat: Uvula is midline, oropharynx is clear and moist and mucous membranes are normal. No tonsillar exudate.  Eyes: Pupils are equal, round, and reactive to light. Conjunctivae and EOM are normal.    Neck: Normal range of motion.  Cardiovascular: Normal rate, regular rhythm, normal heart sounds and intact distal pulses.  Pulmonary/Chest: Effort  normal and breath sounds normal. He has no decreased breath sounds. He has no wheezes. He has no rhonchi. He has no rales.  Musculoskeletal:       Right lower leg: He exhibits no swelling.       Left lower leg: He exhibits no swelling.  Neurological: He is alert and oriented to person, place, and time.  Skin: Skin is warm and dry.  Psychiatric: He has a normal mood and affect.  Vitals reviewed.   Results for orders placed or performed in visit on 09/27/18 (from the past 24 hour(s))  POCT urinalysis dipstick     Status: Abnormal   Collection Time: 09/27/18  3:13 PM  Result Value Ref Range   Color, UA yellow yellow   Clarity, UA clear clear   Glucose, UA negative negative mg/dL   Bilirubin, UA negative negative   Ketones, POC UA negative negative mg/dL   Spec Grav, UA 1.020 1.010 - 1.025   Blood, UA moderate (A) negative   pH, UA 6.0 5.0 - 8.0   Protein Ur, POC negative negative mg/dL   Urobilinogen, UA 0.2 0.2 or 1.0 E.U./dL   Nitrite, UA Negative Negative   Leukocytes, UA Negative Negative  POCT Microscopic Urinalysis ([facility])     Status: Abnormal   Collection Time: 09/27/18  3:35 PM  Result Value Ref Range   WBC,UR,HPF,POC None None WBC/hpf   RBC,UR,HPF,POC Few (A) None RBC/hpf   Bacteria None None, Too numerous to count   Mucus Absent Absent   Epithelial Cells, UR Per Microscopy Few (A) None, Too numerous to count cells/hpf  BP Readings from Last 3 Encounters:  09/27/18 133/81  05/22/18 (!) 154/96  04/02/18 (!) 163/84   Wt Readings from Last 3 Encounters:  09/27/18 198 lb 9.6 oz (90.1 kg)  05/22/18 210 lb 6.4 oz (95.4 kg)  04/02/18 211 lb (95.7 kg)    Assessment and Plan :  1. Essential hypertension Controlled at this time.  Labs pending.  Continue current medication regimen.  Encouraged to continue healthy lifestyle modifications.  Follow-up in 6 months for evaluation. - Comprehensive metabolic panel - CBC with Differential/Platelet - POCT urinalysis  dipstick - Lipid panel - TSH - olmesartan (BENICAR) 5 MG tablet; Take 1 tablet (5 mg total) by mouth daily.  Dispense: 90 tablet; Refill: 1 - amLODipine (NORVASC) 10 MG tablet; Take 1 tablet (10 mg total) by mouth daily.  Dispense: 90 tablet; Refill: 1 - hydrochlorothiazide (HYDRODIURIL) 25 MG tablet; Take 1 tablet (25 mg total) by mouth daily. for blood pressure  Dispense: 90 tablet; Refill: 1  2. Obesity (BMI 30-39.9) Congratulated on weight loss.  Encouraged to continue healthy lifestyle modifications.  3. Mixed hyperlipidemia - Lipid panel  4. Pterygium of left eye - Ambulatory referral to Ophthalmology  Tenna Delaine, PA-C  [facility] [place] 09/27/2018 4:02 PM

## 2018-09-27 NOTE — Telephone Encounter (Signed)
Please call pt and report that was some blood in his urine. He should return in 2 weeks for [place] to repeat this.

## 2018-09-28 LAB — COMPREHENSIVE METABOLIC PANEL
ALBUMIN: 4.5 g/dL (ref 3.5–5.5)
ALT: 29 IU/L (ref 0–44)
AST: 32 IU/L (ref 0–40)
Albumin/Globulin Ratio: 1.4 (ref 1.2–2.2)
Alkaline Phosphatase: 79 IU/L (ref 39–117)
BUN / CREAT RATIO: 12 (ref 9–20)
BUN: 14 mg/dL (ref 6–24)
Bilirubin Total: 0.5 mg/dL (ref 0.0–1.2)
CO2: 28 mmol/L (ref 20–29)
Calcium: 9.5 mg/dL (ref 8.7–10.2)
Chloride: 97 mmol/L (ref 96–106)
Creatinine, Ser: 1.13 mg/dL (ref 0.76–1.27)
GFR calc Af Amer: 87 mL/min/{1.73_m2} (ref >59)
GFR, EST NON AFRICAN AMERICAN: 75 mL/min/{1.73_m2} (ref >59)
GLUCOSE: 86 mg/dL (ref 65–99)
Globulin, Total: 3.2 g/dL (ref 1.5–4.5)
Potassium: 3.3 mmol/L — ABNORMAL LOW (ref 3.5–5.2)
Sodium: 140 mmol/L (ref 134–144)
TOTAL PROTEIN: 7.7 g/dL (ref 6.0–8.5)

## 2018-09-28 LAB — CBC WITH DIFFERENTIAL/PLATELET
Basophils Absolute: 0.1 10*3/uL (ref 0.0–0.2)
Basos: 1 %
EOS (ABSOLUTE): 0.1 10*3/uL (ref 0.0–0.4)
EOS: 1 %
HEMATOCRIT: 40.1 % (ref 37.5–51.0)
HEMOGLOBIN: 14.2 g/dL (ref 13.0–17.7)
Immature Grans (Abs): 0 10*3/uL (ref 0.0–0.1)
Immature Granulocytes: 0 %
LYMPHS ABS: 4.1 10*3/uL — AB (ref 0.7–3.1)
Lymphs: 49 %
MCH: 28.4 pg (ref 26.6–33.0)
MCHC: 35.4 g/dL (ref 31.5–35.7)
MCV: 80 fL (ref 79–97)
MONOCYTES: 7 %
Monocytes Absolute: 0.6 10*3/uL (ref 0.1–0.9)
NEUTROS ABS: 3.5 10*3/uL (ref 1.4–7.0)
Neutrophils: 42 %
Platelets: 284 10*3/uL (ref 150–450)
RBC: 5 x10E6/uL (ref 4.14–5.80)
RDW: 12.9 % (ref 12.3–15.4)
WBC: 8.4 10*3/uL (ref 3.4–10.8)

## 2018-09-28 LAB — LIPID PANEL
Chol/HDL Ratio: 4.1 ratio (ref 0.0–5.0)
Cholesterol, Total: 162 mg/dL (ref 100–199)
HDL: 40 mg/dL (ref >39)
LDL Calculated: 103 mg/dL — ABNORMAL HIGH (ref 0–99)
TRIGLYCERIDES: 97 mg/dL (ref 0–149)
VLDL Cholesterol Cal: 19 mg/dL (ref 5–40)

## 2018-09-28 LAB — TSH: TSH: 1.66 u[IU]/mL (ref 0.450–4.500)

## 2018-10-01 ENCOUNTER — Other Ambulatory Visit: Payer: Self-pay | Admitting: Urgent Care

## 2018-10-01 DIAGNOSIS — I10 Essential (primary) hypertension: Secondary | ICD-10-CM

## 2018-10-28 DIAGNOSIS — H11002 Unspecified pterygium of left eye: Secondary | ICD-10-CM | POA: Diagnosis not present

## 2018-10-28 DIAGNOSIS — H04123 Dry eye syndrome of bilateral lacrimal glands: Secondary | ICD-10-CM | POA: Diagnosis not present

## 2018-12-02 ENCOUNTER — Other Ambulatory Visit: Payer: Self-pay | Admitting: Urgent Care

## 2018-12-02 DIAGNOSIS — I10 Essential (primary) hypertension: Secondary | ICD-10-CM

## 2019-03-25 ENCOUNTER — Encounter: Payer: BLUE CROSS/BLUE SHIELD | Admitting: Emergency Medicine

## 2019-03-25 ENCOUNTER — Telehealth: Payer: Self-pay | Admitting: Emergency Medicine

## 2019-03-25 NOTE — Telephone Encounter (Addendum)
03/25/2019 - PATIENT HAD AN APPOINTMENT FOR A COMPLETE PHYSICAL WITH DR. Kittie Plater ON Tuesday 03/25/2019 AT 2:40pm. HE WAS A NO-SHOW. I TRIED TO CALL PATIENT TO RESCHEDULE BUT HAD TO LEAVE HIM A VOICE MAIL TO RETURN OUR CALL. Eric Obrien

## 2019-04-13 ENCOUNTER — Other Ambulatory Visit: Payer: Self-pay | Admitting: Physician Assistant

## 2019-04-13 DIAGNOSIS — I10 Essential (primary) hypertension: Secondary | ICD-10-CM

## 2019-04-13 NOTE — Telephone Encounter (Signed)
30 day Courtesy refill.  Pt needs OV for further refills. Requested Prescriptions  Pending Prescriptions Disp Refills  . hydrochlorothiazide (HYDRODIURIL) 25 MG tablet [Pharmacy Med Name: HYDROCHLOROTHIAZIDE 25MG  TABLETS] 30 tablet 0    Sig: TAKE 1 TABLET(25 MG) BY MOUTH DAILY FOR BLOOD PRESSURE     Cardiovascular: Diuretics - Thiazide Failed - 04/13/2019 12:51 PM      Failed - K in normal range and within 360 days    Potassium  Date Value Ref Range Status  09/27/2018 3.3 (L) 3.5 - 5.2 mmol/L Final         Failed - Valid encounter within last 6 months    Recent Outpatient Visits          6 months ago Essential hypertension   [facility] [place], [place] D, PA-C   10 months ago Benign paroxysmal positional vertigo, unspecified laterality   [facility] [place], PA-C   1 year ago Essential hypertension   [facility] at [place], [place]   1 year ago Immunization due   [facility] at [place], [place] D, PA-C   1 year ago Essential hypertension   [facility] at [facility], [place], [place] in normal range and within 360 days    Calcium  Date Value Ref Range Status  09/27/2018 9.5 8.7 - 10.2 mg/dL Final         Passed - Cr in normal range and within 360 days    Creat  Date Value Ref Range Status  10/07/2015 1.14 0.60 - 1.35 mg/dL Final   Creatinine, Ser  Date Value Ref Range Status  09/27/2018 1.13 0.76 - 1.27 mg/dL Final         Passed - Na in normal range and within 360 days    Sodium  Date Value Ref Range Status  09/27/2018 140 134 - 144 mmol/L Final         Passed - Last BP in normal range    BP Readings from Last 1 Encounters:  09/27/18 133/81

## 2019-05-16 ENCOUNTER — Other Ambulatory Visit: Payer: Self-pay | Admitting: Family Medicine

## 2019-05-16 DIAGNOSIS — I10 Essential (primary) hypertension: Secondary | ICD-10-CM

## 2019-05-19 ENCOUNTER — Other Ambulatory Visit: Payer: Self-pay | Admitting: Family Medicine

## 2019-05-19 DIAGNOSIS — I10 Essential (primary) hypertension: Secondary | ICD-10-CM

## 2019-05-19 NOTE — Telephone Encounter (Signed)
Patient calling in stating he would like his med refill appointment on Thursday. Call back is [phone].

## 2019-05-20 NOTE — Telephone Encounter (Signed)
Scheduled patient for TOC

## 2019-05-21 ENCOUNTER — Ambulatory Visit: Payer: Self-pay | Admitting: Registered Nurse

## 2019-05-22 ENCOUNTER — Telehealth: Payer: Self-pay | Admitting: Registered Nurse

## 2019-05-22 NOTE — Telephone Encounter (Signed)
Pt would like a courtesy refill on his amLODipine (NORVASC) 10 MG tablet [[id number]]. He missed his 05/21/19 toc appointment to [facility] and he is now rescheduled to 06/06/19. Pharmacy:  [facility], [place] - [address] AT [place]. Please advise at [phone]

## 2019-05-26 ENCOUNTER — Other Ambulatory Visit: Payer: Self-pay

## 2019-05-26 ENCOUNTER — Other Ambulatory Visit: Payer: Self-pay | Admitting: Registered Nurse

## 2019-05-26 DIAGNOSIS — I10 Essential (primary) hypertension: Secondary | ICD-10-CM

## 2019-05-26 MED ORDER — AMLODIPINE BESYLATE 10 MG PO TABS: 10.0000 mg | ORAL_TABLET | Freq: Every day | ORAL | 0 refills | Status: DC

## 2019-05-26 NOTE — Telephone Encounter (Signed)
Rx sent to pharmacy   

## 2019-05-27 NOTE — Telephone Encounter (Signed)
30 day supply has been sent in on yesterday. Must keep appt for additional refills.

## 2019-05-27 NOTE — Telephone Encounter (Signed)
Pt called stating he is out of this medication and that his blood pressure is very high. Please advise.

## 2019-06-06 ENCOUNTER — Encounter: Payer: Self-pay | Admitting: Registered Nurse

## 2019-06-20 ENCOUNTER — Telehealth: Payer: Self-pay | Admitting: Registered Nurse

## 2019-06-20 ENCOUNTER — Other Ambulatory Visit: Payer: Self-pay

## 2019-06-20 ENCOUNTER — Ambulatory Visit (INDEPENDENT_AMBULATORY_CARE_PROVIDER_SITE_OTHER): Payer: No Typology Code available for payment source | Admitting: Registered Nurse

## 2019-06-20 ENCOUNTER — Encounter: Payer: Self-pay | Admitting: Registered Nurse

## 2019-06-20 VITALS — BP 118/73 | HR 74 | Temp 98.8°F | Resp 16 | Ht 65.16 in | Wt 209.0 lb

## 2019-06-20 DIAGNOSIS — Z7689 Persons encountering health services in other specified circumstances: Secondary | ICD-10-CM

## 2019-06-20 DIAGNOSIS — E782 Mixed hyperlipidemia: Secondary | ICD-10-CM | POA: Diagnosis not present

## 2019-06-20 DIAGNOSIS — G4733 Obstructive sleep apnea (adult) (pediatric): Secondary | ICD-10-CM | POA: Diagnosis not present

## 2019-06-20 DIAGNOSIS — I10 Essential (primary) hypertension: Secondary | ICD-10-CM

## 2019-06-20 MED ORDER — HYDROCHLOROTHIAZIDE 25 MG PO TABS: ORAL_TABLET | ORAL | 1 refills | Status: DC

## 2019-06-20 MED ORDER — AMLODIPINE BESYLATE 10 MG PO TABS: 10.0000 mg | ORAL_TABLET | Freq: Every day | ORAL | 1 refills | Status: DC

## 2019-06-20 MED ORDER — OLMESARTAN MEDOXOMIL 5 MG PO TABS: 5.0000 mg | ORAL_TABLET | Freq: Every day | ORAL | 1 refills | Status: DC

## 2019-06-20 MED ORDER — ATORVASTATIN CALCIUM 40 MG PO TABS: 40.0000 mg | ORAL_TABLET | Freq: Every day | ORAL | 1 refills | Status: DC

## 2019-06-20 MED ORDER — METOPROLOL TARTRATE 25 MG PO TABS: 25.0000 mg | ORAL_TABLET | Freq: Two times a day (BID) | ORAL | 1 refills | Status: DC

## 2019-06-20 NOTE — Progress Notes (Signed)
Established Patient Office Visit  Subjective:  Patient ID: Eric Obrien, male    DOB: 05/06/1966  Age: 53 y.o. MRN: [id number]  CC:  Chief Complaint  Patient presents with  . Medication Management    pt needs TOC to manage medications and chronic conditions   . Medication Refill    HPI Eric Obrien presents for TOC and med check  History of HTN, HLD, and Obesity  HTN: Well controlled with current medications. No complaints about AEs. Asymptomatic: denies headaches, chest pain, leg swelling, SHOB  HLD: Well controlled with Atorvastatin. Will continue at this dose. Lab work in 23mos at [employer]  Obesity: Discussed diet and exercise. Currently, patient is working 16-17 hour days with [employer] d/t limited workforce and increasing work Teacher, music.   Additionally, he states that he has been snoring more frequently lately. This has come with daytime fatigue and headaches on waking.   Past Medical History:  Diagnosis Date  . Hypertension     History reviewed. No pertinent surgical history.  History reviewed. No pertinent family history.  Social History   Socioeconomic History  . Marital status: Married    Spouse name: Not on file  . Number of children: 2  . Years of education: Not on file  . Highest education level: Not on file  Occupational History  . Occupation: [employer]  . Financial resource strain: Not hard at all  . Food insecurity    Worry: Never true    Inability: Never true  . Transportation needs    Medical: No    Non-medical: No  Tobacco Use  . Smoking status: Never Smoker  . Smokeless tobacco: Never Used  Substance and Sexual Activity  . Alcohol use: No    Comment: sometimes  . Drug use: No  . Sexual activity: Not Currently  Lifestyle  . Physical activity    Days per week: 5 days    Minutes per session: 50 min  . Stress: To some extent  Relationships  . Social Herbalist on phone: Three times a week    Gets together: Twice a week   Attends religious service: Patient refused    Active member of club or organization: Patient refused    Attends meetings of clubs or organizations: Patient refused    Relationship status: Patient refused  . Intimate partner violence    Fear of current or ex partner: No    Emotionally abused: No    Physically abused: No    Forced sexual activity: No  Other Topics Concern  . Not on file  Social History Narrative  . Not on file    Outpatient Medications Prior to Visit  Medication Sig Dispense Refill  . Multiple Vitamin (MULTIVITAMIN WITH MINERALS) TABS tablet Take 1 tablet by mouth daily.    . prednisoLONE acetate (PRED FORTE) 1 % ophthalmic suspension     . PREVIDENT 5000 SENSITIVE 1.1-5 % PSTE     . RESTASIS 0.05 % ophthalmic emulsion     . amLODipine (NORVASC) 10 MG tablet Take 1 tablet (10 mg total) by mouth daily. 30 tablet 0  . atorvastatin (LIPITOR) 40 MG tablet Take 1 tablet (40 mg total) by mouth daily. 90 tablet 3  . hydrochlorothiazide (HYDRODIURIL) 25 MG tablet TAKE 1 TABLET(25 MG) BY MOUTH DAILY FOR BLOOD PRESSURE 30 tablet 0  . metoprolol tartrate (LOPRESSOR) 25 MG tablet Take 1 tablet (25 mg total) by mouth 2 (two) times daily. 180 tablet 1  .  olmesartan (BENICAR) 5 MG tablet Take 1 tablet (5 mg total) by mouth daily. 90 tablet 1   No facility-administered medications prior to visit.     No Known Allergies  ROS Review of Systems  Constitutional: Negative.   Respiratory: Negative.   Cardiovascular: Negative.   Gastrointestinal: Negative.   Neurological: Negative.   All other systems reviewed and are negative.     Objective:    Physical Exam  Constitutional: He is oriented to person, place, and time. He appears well-developed and well-nourished. No distress.  Cardiovascular: Normal rate and regular rhythm.  Pulmonary/Chest: Effort normal. No respiratory distress.  Neurological: He is alert and oriented to person, place, and time.  Skin: Skin is warm and  dry. No rash noted. He is not diaphoretic. No erythema. No pallor.  Psychiatric: He has a normal mood and affect. His behavior is normal. Judgment and thought content normal.  Nursing note and vitals reviewed.   BP 118/73   Pulse 74   Temp 98.8 F (37.1 C) (Oral)   Resp 16   Ht 5' 5.16" (1.655 m)   Wt 209 lb (94.8 kg)   SpO2 98%   BMI 34.61 kg/m  Wt Readings from Last 3 Encounters:  06/20/19 209 lb (94.8 kg)  09/27/18 198 lb 9.6 oz (90.1 kg)  05/22/18 210 lb 6.4 oz (95.4 kg)     Health Maintenance Due  Topic Date Due  . HIV Screening  04/11/1982  . TETANUS/TDAP  04/11/1986  . COLONOSCOPY  04/11/2017    There are no preventive care reminders to display for this patient.  Lab Results  Component Value Date   TSH 1.660 09/27/2018   Lab Results  Component Value Date   WBC 8.4 09/27/2018   HGB 14.2 09/27/2018   HCT 40.1 09/27/2018   MCV 80 09/27/2018   PLT 284 09/27/2018   Lab Results  Component Value Date   NA 140 09/27/2018   K 3.3 (L) 09/27/2018   CO2 28 09/27/2018   GLUCOSE 86 09/27/2018   BUN 14 09/27/2018   CREATININE 1.13 09/27/2018   BILITOT 0.5 09/27/2018   ALKPHOS 79 09/27/2018   AST 32 09/27/2018   ALT 29 09/27/2018   PROT 7.7 09/27/2018   ALBUMIN 4.5 09/27/2018   CALCIUM 9.5 09/27/2018   Lab Results  Component Value Date   CHOL 162 09/27/2018   Lab Results  Component Value Date   HDL 40 09/27/2018   Lab Results  Component Value Date   LDLCALC 103 (H) 09/27/2018   Lab Results  Component Value Date   TRIG 97 09/27/2018   Lab Results  Component Value Date   CHOLHDL 4.1 09/27/2018   No results found for: HGBA1C    Assessment & Plan:   Problem List Items Addressed This Visit      Other   Hyperlipidemia - Primary   Relevant Medications   amLODipine (NORVASC) 10 MG tablet   atorvastatin (LIPITOR) 40 MG tablet   hydrochlorothiazide (HYDRODIURIL) 25 MG tablet   metoprolol tartrate (LOPRESSOR) 25 MG tablet   olmesartan (BENICAR)  5 MG tablet    Other Visit Diagnoses    Essential hypertension       Relevant Medications   amLODipine (NORVASC) 10 MG tablet   atorvastatin (LIPITOR) 40 MG tablet   hydrochlorothiazide (HYDRODIURIL) 25 MG tablet   metoprolol tartrate (LOPRESSOR) 25 MG tablet   olmesartan (BENICAR) 5 MG tablet      Meds ordered this encounter  Medications  .  amLODipine (NORVASC) 10 MG tablet    Sig: Take 1 tablet (10 mg total) by mouth daily.    Dispense:  90 tablet    Refill:  1    Order Specific Question:   Supervising Provider    Answer:   Delia Chimes A [id number]  . atorvastatin (LIPITOR) 40 MG tablet    Sig: Take 1 tablet (40 mg total) by mouth daily.    Dispense:  90 tablet    Refill:  1    Order Specific Question:   Supervising Provider    Answer:   Delia Chimes A [id number]  . hydrochlorothiazide (HYDRODIURIL) 25 MG tablet    Sig: TAKE 1 TABLET(25 MG) BY MOUTH DAILY FOR BLOOD PRESSURE    Dispense:  90 tablet    Refill:  1    Pt needs office visit for further refills. Courtesy 30 day refill given.    Order Specific Question:   Supervising Provider    Answer:   Forrest Moron [id number]  . metoprolol tartrate (LOPRESSOR) 25 MG tablet    Sig: Take 1 tablet (25 mg total) by mouth 2 (two) times daily.    Dispense:  180 tablet    Refill:  1    Order Specific Question:   Supervising Provider    Answer:   Delia Chimes A [id number]  . olmesartan (BENICAR) 5 MG tablet    Sig: Take 1 tablet (5 mg total) by mouth daily.    Dispense:  90 tablet    Refill:  1    Order Specific Question:   Supervising Provider    Answer:   Forrest Moron [id number]    Follow-up: 6 mos for CPE, labs, med refills  PLAN  180 day supplies of meds given  Pt to schedule CPE and med refills for January 2021  Referral sent for sleep study - will follow results. Discussed weight loss as being paramount for OSA, but understand that it is difficult while working long days and having stress surrounding  pandemic  Patient encouraged to call clinic with any questions, comments, or concerns.     Maximiano Coss, NP

## 2019-06-20 NOTE — Telephone Encounter (Addendum)
06/20/2019 - PATIENT HAD AN APPOINTMENT ON Friday (06/20/2019) WITH Eric Obrien. HE REQUESTED HE RETURN IN 6 MONTHS FOR A FOLLOW-UP ON HIS MEDICAL CONDITIONS. I SCHEDULED HIM FOR Monday (12/22/2019) AT 7:50am BUT I TOLD THE PATIENT 8:00am. Eric Obrien'S SCHEDULE HAS CHANGED AND HE DOES NOT HAVE AN 8:00am. I TRIED TO CALL THE PATIENT TO TELL HIM TO CHOOSE IF HE WANTS THE 7:50am OR 8:10am BUT HAD TO LEAVE HIM A VOICE MAIL TO RETURN MY CALL. THEN WE CAN FIX IT IN THE COMPUTER IF NEEDED. [place]

## 2019-06-20 NOTE — Patient Instructions (Signed)
° ° ° °  If you have lab work done today you will be contacted with your lab results within the next 2 weeks.  If you have not heard from us then please contact us. The fastest way to get your results is to register for My Chart. ° ° °IF you received an x-ray today, you will receive an invoice from [facility]. Please contact [facility] at [phone] with questions or concerns regarding your invoice.  ° °IF you received labwork today, you will receive an invoice from [facility]. Please contact [facility] at [phone] with questions or concerns regarding your invoice.  ° °Our billing staff will not be able to assist you with questions regarding bills from these companies. ° °You will be contacted with the lab results as soon as they are available. The fastest way to get your results is to activate your My Chart account. Instructions are located on the last page of this paperwork. If you have not heard from us regarding the results in 2 weeks, please contact this office. °  ° ° ° °

## 2019-06-24 NOTE — Telephone Encounter (Signed)
Pt called back and stated that he can come in at 7:50.

## 2019-07-17 ENCOUNTER — Ambulatory Visit (INDEPENDENT_AMBULATORY_CARE_PROVIDER_SITE_OTHER): Payer: No Typology Code available for payment source | Admitting: Neurology

## 2019-07-17 ENCOUNTER — Other Ambulatory Visit: Payer: Self-pay

## 2019-07-17 ENCOUNTER — Encounter: Payer: Self-pay | Admitting: Neurology

## 2019-07-17 VITALS — BP 150/106 | HR 69 | Ht 65.0 in | Wt 209.0 lb

## 2019-07-17 DIAGNOSIS — R0683 Snoring: Secondary | ICD-10-CM

## 2019-07-17 DIAGNOSIS — R030 Elevated blood-pressure reading, without diagnosis of hypertension: Secondary | ICD-10-CM

## 2019-07-17 DIAGNOSIS — G4719 Other hypersomnia: Secondary | ICD-10-CM

## 2019-07-17 DIAGNOSIS — R351 Nocturia: Secondary | ICD-10-CM

## 2019-07-17 DIAGNOSIS — E669 Obesity, unspecified: Secondary | ICD-10-CM | POA: Diagnosis not present

## 2019-07-17 DIAGNOSIS — R0681 Apnea, not elsewhere classified: Secondary | ICD-10-CM | POA: Diagnosis not present

## 2019-07-17 DIAGNOSIS — R51 Headache: Secondary | ICD-10-CM

## 2019-07-17 DIAGNOSIS — R519 Headache, unspecified: Secondary | ICD-10-CM

## 2019-07-17 DIAGNOSIS — R635 Abnormal weight gain: Secondary | ICD-10-CM

## 2019-07-17 NOTE — Progress Notes (Signed)
Subjective:    Patient ID: Eric Obrien is a 53 y.o. male.  HPI     Star Age, MD, PhD [facility] [address].O. [place], [place] [address]  Dear [name],   I saw your patient, Eric Obrien, upon your kind request, in my sleep clinic today for initial consultation of his sleep disorder, in particular, concern for underlying obstructive sleep apnea.  The patient is unaccompanied today.  As you know, Eric Obrien is a 53 year old right-handed gentleman with an underlying medical history of hypertension, hyperlipidemia, chronic dry eyes, and obesity, who reports snoring and excessive daytime somnolence as well as morning headaches.  I reviewed your office note from 06/20/2019. His Epworth sleepiness score is 10 out of 24, fatigue severity score is 31 out of 63.  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  His bedtime is generally around 10, he often tries to go back to sleep around 5 AM and has to wake up at 630 for work.  He has a 52 year old and 79-year-old.  They have no pets in the house, he does have a TV in the bedroom and tries to turn it off before falling asleep.  He has nocturia, twice per average night typically.  He has had rare morning headaches.  His snoring has increased.  His wife has noted choking sounds while he is asleep.  He has gained about 10 pounds in the past 3 months.  He is a non-smoker and drinks alcohol rarely, caffeine in the form of coffee, 2 cups/day on average.  He is not aware of any family history of snoring or sleep apnea.  His Past Medical History Is Significant For: Past Medical History:  Diagnosis Date  . Hypertension     His Past Surgical History Is Significant For: No past surgical history on file.  His Family History Is Significant For: No family history on  file.  His Social History Is Significant For: Social History   Socioeconomic History  . Marital status: Married    Spouse name: Not on file  . Number of children: 2  . Years of education: Not on file  . Highest education level: Not on file  Occupational History  . Occupation: [employer]  . Financial resource strain: Not hard at all  . Food insecurity    Worry: Never true    Inability: Never true  . Transportation needs    Medical: No    Non-medical: No  Tobacco Use  . Smoking status: Never Smoker  . Smokeless tobacco: Never Used  Substance and Sexual Activity  . Alcohol use: No    Comment: sometimes  . Drug use: No  . Sexual activity: Not Currently  Lifestyle  . Physical activity    Days per week: 5 days    Minutes per session: 50 min  . Stress: To some extent  Relationships  . Social Herbalist on phone: Three times a week    Gets together: Twice a week    Attends religious service: Patient refused    Active member of club or organization: Patient refused    Attends meetings of clubs or organizations: Patient refused    Relationship status: Patient refused  Other Topics Concern  . Not on file  Social History Narrative  . Not on file    His Allergies Are:  No Known Allergies:   His Current Medications Are:  Outpatient Encounter Medications as of 07/17/2019  Medication Sig  . amLODipine (NORVASC) 10 MG tablet Take 1 tablet (10 mg total) by mouth daily.  Marland Kitchen atorvastatin (LIPITOR) 40 MG tablet Take 1 tablet (40 mg total) by mouth daily.  . hydrochlorothiazide (HYDRODIURIL) 25 MG tablet TAKE 1 TABLET(25 MG) BY MOUTH DAILY FOR BLOOD PRESSURE  . metoprolol tartrate (LOPRESSOR) 25 MG tablet Take 1 tablet (25 mg total) by mouth 2 (two) times daily.  . Multiple Vitamin (MULTIVITAMIN WITH MINERALS) TABS tablet Take 1 tablet by mouth daily.  Marland Kitchen olmesartan (BENICAR) 5 MG tablet Take 1 tablet (5 mg total) by mouth daily.  . prednisoLONE acetate (PRED  FORTE) 1 % ophthalmic suspension   . PREVIDENT 5000 SENSITIVE 1.1-5 % PSTE   . RESTASIS 0.05 % ophthalmic emulsion    No facility-administered encounter medications on file as of 07/17/2019.   :  Review of Systems:  Out of a complete 14 point review of systems, all are reviewed and negative with the exception of these symptoms as listed below: Review of Systems  Neurological:       Pt presents today to discuss his sleep. Pt has never had a sleep study but does endorse snoring.  Epworth Sleepiness Scale 0= would never doze 1= slight chance of dozing 2= moderate chance of dozing 3= high chance of dozing  Sitting and reading: 2 Watching TV: 3 Sitting inactive in a public place (ex. Theater or meeting): 0 As a passenger in a car for an hour without a break: 2 Lying down to rest in the afternoon: 3 Sitting and talking to someone: 0 Sitting quietly after lunch (no alcohol): 0 In a car, while stopped in traffic: 0 Total: 10     Objective:  Neurological Exam  Physical Exam Physical Examination:   Vitals:   07/17/19 1533  BP: (!) 150/106  Pulse: 69   General Examination: The patient is a very pleasant 53 y.o. male in no acute distress. He appears well-developed and well-nourished and well groomed.   HEENT: Normocephalic, atraumatic, pupils are equal, round and reactive to light and accommodation. Corrective eyeglasses in place.  Extraocular tracking is well preserved, hearing grossly intact.  Face is symmetric with normal facial animation.  Nasal inspection reveals mildly narrow nasal passages and mild inferior turbinate hypertrophy.  Airway examination reveals market airway crowding, Mallampati class III.  He has a larger tongue, larger uvula, tonsils not fully visualized, about 1+ bilaterally. Speech is clear with no dysarthria noted.  Tongue protrudes centrally in palate elevates symmetrically.  Neck circumference is 17-3/8 inches.  He has a minimal overbite.   Chest: Clear to  auscultation without wheezing, rhonchi or crackles noted.  Heart: S1+S2+0, regular and normal without murmurs, rubs or gallops noted.   Abdomen: Soft, non-tender and non-distended with normal bowel sounds appreciated on auscultation.  Extremities: There is no pitting edema in the distal lower extremities bilaterally.  Skin: Warm and dry without trophic changes noted.  Musculoskeletal: exam reveals no obvious joint deformities, tenderness or joint swelling or erythema.   Neurologically:  Mental status: The patient is awake, alert and oriented in all 4 spheres. His immediate and remote memory, attention, language skills and fund of knowledge are appropriate. There is no evidence of aphasia, agnosia, apraxia or anomia. Speech is clear with normal prosody and enunciation. Thought process is  linear. Mood is normal and affect is normal.  Cranial nerves II - XII are as described above under HEENT exam. In addition: shoulder shrug is normal with equal shoulder height noted. Motor exam: Normal bulk, strength and tone is noted. There is no drift, tremor or rebound. Romberg is negative. Fine motor skills and coordination: grossly intact.  Cerebellar testing: No dysmetria or intention tremor. There is no truncal or gait ataxia.  Sensory exam: intact to light touch in the upper and lower extremities.  Gait, station and balance: He stands easily. No veering to one side is noted. No leaning to one side is noted. Posture is age-appropriate and stance is narrow based. Gait shows normal stride length and normal pace. No problems turning are noted. Tandem walk is unremarkable.   Assessment and Plan:  In summary, Iwao Benegas is a very pleasant 53 y.o.-year old male with an underlying medical history of hypertension, hyperlipidemia, chronic dry eyes, and obesity, whose history and physical exam are concerning for obstructive sleep apnea (OSA). I had a long chat with the patient about my findings and the diagnosis  of OSA, its prognosis and treatment options. We talked about medical treatments, surgical interventions and non-pharmacological approaches. I explained in particular the risks and ramifications of untreated moderate to severe OSA, especially with respect to developing cardiovascular disease down the Road, including congestive heart failure, difficult to treat hypertension, cardiac arrhythmias, or stroke. Even type 2 diabetes has, in part, been linked to untreated OSA. Symptoms of untreated OSA include daytime sleepiness, memory problems, mood irritability and mood disorder such as depression and anxiety, lack of energy, as well as recurrent headaches, especially morning headaches. We talked about trying to maintain a healthy lifestyle in general, as well as the importance of weight control. I encouraged the patient to eat healthy, exercise daily and keep well hydrated, to keep a scheduled bedtime and wake time routine, to not skip any meals and eat healthy snacks in between meals. I advised the patient not to drive when feeling sleepy. I recommended the following at this time: sleep study.   I explained the sleep test procedure to the patient and also outlined possible surgical and non-surgical treatment options of OSA, including the use of a custom-made dental device (which would require a referral to a specialist dentist or oral surgeon), upper airway surgical options, such as pillar implants, radiofrequency surgery, tongue base surgery, and UPPP (which would involve a referral to an ENT surgeon). Rarely, jaw surgery such as mandibular advancement may be considered.  I also explained the CPAP treatment option to the patient, who indicated that he would be willing to try CPAP if the need arises. I explained the importance of being compliant with PAP treatment, not only for insurance purposes but primarily to improve His symptoms, and for the patient's long term health benefit, including to reduce His  cardiovascular risks. I answered all his questions today and the patient was in agreement. I plan to see him back after the sleep study is completed and encouraged him to call with any interim questions, concerns, problems or updates.   Thank you very much for allowing me to participate in the care of this nice patient. If I can be of any further assistance to you please do not hesitate to call me at [phone].  Sincerely,   Star Age, MD, PhD

## 2019-07-17 NOTE — Patient Instructions (Signed)

## 2019-08-05 ENCOUNTER — Telehealth: Payer: Self-pay

## 2019-08-05 NOTE — Telephone Encounter (Signed)
We have attempted to call the patient two times to schedule sleep study.  Patient has been unavailable at the phone numbers we have on file and has not returned our calls. If patient calls back we will schedule them for their sleep study.  

## 2019-08-21 ENCOUNTER — Telehealth: Payer: Self-pay | Admitting: Neurology

## 2019-08-21 NOTE — Telephone Encounter (Signed)
I have tried to call patient 3 times, twice in August and once in September. Voicemail has been full and I have left two messages for pt to give me a call. I will be glad to try again today to contact patient.

## 2019-08-21 NOTE — Telephone Encounter (Signed)
Pt states it has been about 19 days since he was seen and he has not been contacted re: if he has been approved for a sleep study or not.  Please call

## 2019-08-27 ENCOUNTER — Ambulatory Visit (INDEPENDENT_AMBULATORY_CARE_PROVIDER_SITE_OTHER): Payer: No Typology Code available for payment source | Admitting: Neurology

## 2019-08-27 DIAGNOSIS — G4733 Obstructive sleep apnea (adult) (pediatric): Secondary | ICD-10-CM | POA: Diagnosis not present

## 2019-08-27 DIAGNOSIS — R351 Nocturia: Secondary | ICD-10-CM

## 2019-08-27 DIAGNOSIS — E669 Obesity, unspecified: Secondary | ICD-10-CM

## 2019-08-27 DIAGNOSIS — R519 Headache, unspecified: Secondary | ICD-10-CM

## 2019-08-27 DIAGNOSIS — R635 Abnormal weight gain: Secondary | ICD-10-CM

## 2019-08-27 DIAGNOSIS — R0681 Apnea, not elsewhere classified: Secondary | ICD-10-CM

## 2019-08-27 DIAGNOSIS — G4719 Other hypersomnia: Secondary | ICD-10-CM

## 2019-08-27 DIAGNOSIS — R0683 Snoring: Secondary | ICD-10-CM

## 2019-08-27 DIAGNOSIS — R030 Elevated blood-pressure reading, without diagnosis of hypertension: Secondary | ICD-10-CM

## 2019-09-09 ENCOUNTER — Telehealth: Payer: Self-pay

## 2019-09-09 NOTE — Addendum Note (Signed)
Addended by: Star Age on: 09/09/2019 08:12 AM   Modules accepted: Orders

## 2019-09-09 NOTE — Telephone Encounter (Signed)

## 2019-09-09 NOTE — Procedures (Signed)
Patient Information     First Name: Kofi Last Name: Costas ID: [id number]  Birth Date: Aug 10, 1966 Age: 53 Gender: Male  Referring Provider: Maximiano Coss, NP BMI: 34.9 (W=209 lb, H=5' 5'')  Neck Circ.:  17 '' Epworth:  10/24   Sleep Study Information    Study Date: Aug 27, 2019 S/H/A Version: [id number] / 4.1.1528 / 9  History:    53 year old man with a history of hypertension, hyperlipidemia, chronic dry eyes, and obesity, who reports snoring and excessive daytime somnolence as well as morning headaches.  Summary & Diagnosis:     Severe OSA Recommendations:     This home sleep test demonstrates severe obstructive sleep apnea with a total AHI of 101.4/hour and O2 nadir of 66%. Treatment with positive airway pressure (in the form of CPAP) is recommended. This will require a full night CPAP titration study for proper treatment settings, O2 monitoring and mask fitting. Based on the severity of the sleep disordered breathing an attended titration study is indicated and highly advisable. Please note that untreated obstructive sleep apnea may carry additional perioperative morbidity. Patients with significant obstructive sleep apnea should receive perioperative PAP therapy and the surgeons and particularly the anesthesiologist should be informed of the diagnosis and the severity of the sleep disordered breathing. The patient should be cautioned not to drive, work at heights, or operate dangerous or heavy equipment when tired or sleepy. Review and reiteration of good sleep hygiene measures should be pursued with any patient. Other causes of the patient's symptoms, including circadian rhythm disturbances, an underlying mood disorder, medication effect and/or an underlying medical problem cannot be ruled out based on this test. Clinical correlation is recommended. The patient and his referring provider will be notified of the test results. The patient will be seen in follow up in sleep clinic at [facility].   I certify that I have reviewed the raw data recording prior to the issuance of this report in accordance with the standards of the American Academy of Sleep Medicine (AASM).  Star Age, MD, PhD [facility]) Diplomat, ABPN (Neurology and Sleep)             Sleep Summary  Oxygen Saturation Statistics   Start Study Time: End Study Time: Total Recording Time:   10:52:45 PM    6:17:40 AM    7 h, 24 min  Total Sleep Time % REM of Sleep Time:  6 h, 58 min  23.7    Mean: 90 Minimum: 66 Maximum: 100  Mean of Desaturations Nadirs (%):   83  Oxygen Desaturation. %: 4-9 10-20 >20 Total  Events Number Total  72 11.3  479 75.1  87 13.6  638 100.0  Oxygen Saturation:  <90 <=88 <85 <80 <70  Duration (minutes): Sleep % 166.4 147.6 39.8 35.3 75.6 18.1 19.8 4.7 0.6 0.2     Respiratory Indices      Total Events REM NREM All Night  pRDI:  660  pAHI:  660 ODI:  638  pAHIc: % CSR: 94.6 94.6 90.0 103.1 103.1 100.0 101.4 101.4 98.0       Pulse Rate Statistics during Sleep (BPM)      Mean: 78 Minimum: 47 Maximum: 110    Indices are calculated using technically valid sleep time of  6 hrs, 30 min. Inconclusive AHI Central pRDI/pAHI are calculated using oxi desaturations ? 3%  Body Position Statistics  Position Supine Prone Right Left Non-Supine  Sleep (min) 184.4 45.0 159.0 30.0 234.0  Sleep % 44.1 10.8 38.0 7.2 55.9  pRDI 101.0 101.5 101.6 102.3 101.7  pAHI 101.0 101.5 101.6 102.3 101.7  ODI 98.7 90.4 98.7 100.3 97.5     Snoring Statistics Snoring Level (dB) >40 >50 >60 >70 >80 >Threshold (45)  Sleep (min) 235.6 112.2 20.8 0.4 0.0 151.1  Sleep % 56.3 26.8 5.0 0.1 0.0 36.1    Mean: 46 dB Sleep Stages Chart                                                                             pAHI=101.4                                                                                              Mild              Moderate                     Severe                                                 5              15                    30

## 2019-09-09 NOTE — Progress Notes (Signed)
Patient referred by Maximiano Coss, NP, seen by me on 07/17/19, HST on 08/27/19:  Please call and notify the patient that the recent home sleep test did showed severe obstructive sleep apnea and that I recommend treatment for this in the form of CPAP. I will request an overnight sleep study for proper titration and mask fitting.   Star Age, MD, PhD [facility])

## 2019-09-09 NOTE — Telephone Encounter (Signed)
-----   Message from Star Age, MD sent at 09/09/2019  8:12 AM EDT ----- Patient referred by Maximiano Coss, NP, seen by me on 07/17/19, HST on 08/27/19:  Please call and notify the patient that the recent home sleep test did showed severe obstructive sleep apnea and that I recommend treatment for this in the form of CPAP. I will request an overnight sleep study for proper titration and mask fitting.   Star Age, MD, PhD [facility])

## 2019-09-11 ENCOUNTER — Other Ambulatory Visit (HOSPITAL_COMMUNITY): Payer: Self-pay

## 2019-09-26 ENCOUNTER — Other Ambulatory Visit (HOSPITAL_COMMUNITY)
Admission: RE | Admit: 2019-09-26 | Discharge: 2019-09-26 | Disposition: A | Discharge status: Home / Self Care | Payer: PRIVATE HEALTH INSURANCE | Source: Ambulatory Visit | Attending: Neurology | Admitting: Neurology

## 2019-09-26 DIAGNOSIS — Z01812 Encounter for preprocedural laboratory examination: Secondary | ICD-10-CM | POA: Insufficient documentation

## 2019-09-26 DIAGNOSIS — Z20828 Contact with and (suspected) exposure to other viral communicable diseases: Secondary | ICD-10-CM | POA: Insufficient documentation

## 2019-09-27 LAB — NOVEL CORONAVIRUS, NAA (HOSP ORDER, SEND-OUT TO REF LAB; TAT 18-24 HRS): SARS-CoV-2, NAA: NOT DETECTED

## 2019-09-29 ENCOUNTER — Ambulatory Visit (INDEPENDENT_AMBULATORY_CARE_PROVIDER_SITE_OTHER): Payer: No Typology Code available for payment source | Admitting: Neurology

## 2019-09-29 DIAGNOSIS — G4733 Obstructive sleep apnea (adult) (pediatric): Secondary | ICD-10-CM

## 2019-09-29 DIAGNOSIS — R519 Headache, unspecified: Secondary | ICD-10-CM

## 2019-09-29 DIAGNOSIS — R030 Elevated blood-pressure reading, without diagnosis of hypertension: Secondary | ICD-10-CM

## 2019-09-29 DIAGNOSIS — Z9989 Dependence on other enabling machines and devices: Secondary | ICD-10-CM

## 2019-09-29 DIAGNOSIS — G4731 Primary central sleep apnea: Secondary | ICD-10-CM

## 2019-09-29 DIAGNOSIS — R351 Nocturia: Secondary | ICD-10-CM

## 2019-09-29 DIAGNOSIS — E669 Obesity, unspecified: Secondary | ICD-10-CM

## 2019-09-29 DIAGNOSIS — G4719 Other hypersomnia: Secondary | ICD-10-CM

## 2019-10-09 ENCOUNTER — Telehealth: Payer: Self-pay

## 2019-10-09 NOTE — Progress Notes (Signed)
Patient referred by Maximiano Coss, NP, seen by me on 07/17/19, HST on 08/27/19, CPAP titration study on 09/29/19.  Please call and inform patient that I have entered an order for treatment with positive airway pressure (PAP) treatment for obstructive sleep apnea (OSA). He did well during the latest sleep study with BiPAP ST. We will, therefore, arrange for a machine for home use through a DME (durable medical equipment) company of His choice; and I will see the patient back in follow-up in about 10 weeks. Please also explain to the patient that I will be looking out for compliance data, which can be downloaded from the machine (stored on an SD card, that is inserted in the machine) or via remote access through a modem, that is built into the machine. At the time of the followup appointment we will discuss sleep study results and how it is going with PAP treatment at home. Please advise patient to bring His machine at the time of the first FU visit, even though this is cumbersome. Bringing the machine for every visit after that will likely not be needed, but often helps for the first visit to troubleshoot if needed. Please re-enforce the importance of compliance with treatment and the need for [place] to monitor compliance data - often an insurance requirement and actually good feedback for the patient as far as how they are doing.  Also remind patient, that any interim PAP machine or mask issues should be first addressed with the DME company, as they can often help better with technical and mask fit issues. Please ask if patient has a preference regarding DME company.  Please also make sure, the patient has a follow-up appointment with me in about 10 weeks from the setup date, thanks. May see one of our nurse practitioners if needed for proper timing of the FU appointment.  Please fax or rout report to the referring provider. Thanks,   Star Age, MD, PhD [facility])

## 2019-10-09 NOTE — Addendum Note (Signed)
Addended by: Star Age on: 10/09/2019 02:38 PM   Modules accepted: Orders

## 2019-10-09 NOTE — Procedures (Signed)
PATIENT'S NAME:  Eric Obrien, Eric Obrien DOB:      [birth year]      MR#:    [id number]     DATE OF RECORDING: 09/29/2019 REFERRING M.D.:  Maximiano Coss, NP Study Performed:   CPAP  Titration HISTORY:  53 year old man with a history of hypertension, hyperlipidemia, chronic dry eyes, and obesity, who presents for a full night titration study to treat his severe sleep apnea. His home sleep test from 08/27/19 demonstrates severe obstructive sleep apnea with a total AHI of 101.4/hour and O2 nadir of 66%. The patient endorsed the Epworth Sleepiness Scale at 10/24 points. The patient's weight 209 pounds with a height of 65 (inches), resulting in a BMI of 34.9 kg/m2. The patient's neck circumference measured 17.5 inches.  CURRENT MEDICATIONS: Norvasc, Lipitor, Hydrodiuril, Lopressor, Multivitamin, Benicar.  PROCEDURE:  This is a multichannel digital polysomnogram utilizing the SomnoStar 11.2 system.  Electrodes and sensors were applied and monitored per AASM Specifications.   EEG, EOG, Chin and Limb EMG, were sampled at 200 Hz.  ECG, Snore and Nasal Pressure, Thermal Airflow, Respiratory Effort, CPAP Flow and Pressure, Oximetry was sampled at 50 Hz. Digital video and audio were recorded.      The patient was fitted with a medium Quattro FFM. CPAP was initiated at 5 cmH20 with heated humidity per AASM standards and pressure was advanced to 12 cm H20 because of hypopneas, apneas and desaturations. He started having significant central apneas, and was switched to BIPAP of 13/9 cm and further titrated to 14/10 cm. Since he had ongoing central respiratory events, a back up rate of 14/min was added and he was titrated further on BiPAP ST. On a pressure of 20/16 cmH20, with a rate of 14, his AHI was 0/hour with supine NREM sleep achieved and O2 nadir of 89%.   Lights Out was at 21:43 and Lights On at 05:11. Total recording time (TRT) was 449 minutes, with a total sleep time (TST) of 330.5 minutes. The patient's sleep latency was  14 minutes. REM latency was 50.5 minutes, which is mildly reduced. The sleep efficiency was 73.6 %.    SLEEP ARCHITECTURE: WASO (Wake after sleep onset)  was 106 minutes with one long period of wakefulness and otherwise mild sleep fragmentation noted. There were 9 minutes in Stage N1, 153.5 minutes Stage N2, 74 minutes Stage N3 and 94 minutes in Stage REM.  The percentage of Stage N1 was 2.7%, Stage N2 was 46.4%, Stage N3 was 22.4% and Stage R (REM sleep) was 28.4%, which is mildly increased. the arousals were noted as: 31 were spontaneous, 0 were associated with PLMs, 76 were associated with respiratory events.  RESPIRATORY ANALYSIS:  There was a total of 275 respiratory events: 60 obstructive apneas, 130 central apneas and 0 mixed apneas with a total of 190 apneas and an apnea index (AI) of 34.5 /hour. There were 85 hypopneas with a hypopnea index of 15.4/hour. The patient also had 0 respiratory event related arousals (RERAs).      The total APNEA/HYPOPNEA INDEX  (AHI) was 49.9 /hour and the total RESPIRATORY DISTURBANCE INDEX was 49.9 /hour  60 events occurred in REM sleep and 215 events in NREM. The REM AHI was 38.3 /hour versus a non-REM AHI of 54.5 /hour.  The patient spent 203 minutes of total sleep time in the supine position and 128 minutes in non-supine. The supine AHI was 43.4, versus a non-supine AHI of 60.2.  OXYGEN SATURATION & C02:  The baseline 02 saturation was 95%,  with the lowest being 68%. Time spent below 89% saturation equaled 116 minutes.  PERIODIC LIMB MOVEMENTS:  The patient had a total of 0 Periodic Limb Movements. The Periodic Limb Movement (PLM) index was 0 and the PLM Arousal index was 0 /hour.  Audio and video analysis did not show any abnormal or unusual movements, behaviors, phonations or vocalizations. The patient took 1 bathroom break. The EKG was in keeping with normal sinus rhythm.  Post-study, the patient indicated that sleep was better than usual.    DIAGNOSIS 1. Obstructive Sleep Apnea  2. Central Sleep Apnea  3. Insufficient treatment with CPAP  PLANS/RECOMMENDATIONS:  1. This study demonstrates eventual resolution of the patient's obstructive sleep apnea with BiPAP ST. CPAP therapy alone was not sufficient in treating his mixed sleep apnea. Standard BiPAP therapy did not sufficiently treat his central apneas. I will, therefore, start the patient on home BiPAP ST treatment at a pressure of 20/16 cm with a back up rate of 14/min, via medium FFM with heated humidity. The patient should be reminded to be fully compliant with PAP therapy to improve sleep related symptoms and decrease long term cardiovascular risks. The patient should be reminded, that it may take up to 3 months to get fully used to using PAP with all planned sleep. The earlier full compliance is achieved, the better long term compliance tends to be. Please note that untreated obstructive sleep apnea may carry additional perioperative morbidity. Patients with significant obstructive sleep apnea should receive perioperative PAP therapy and the surgeons and particularly the anesthesiologist should be informed of the diagnosis and the severity of the sleep disordered breathing. 2. The patient should be cautioned not to drive, work at heights, or operate dangerous or heavy equipment when tired or sleepy. Review and reiteration of good sleep hygiene measures should be pursued with any patient. 3. The patient will be seen in follow-up in the sleep clinic at [facility] for discussion of the test results, symptom and treatment compliance review, further management strategies, etc. The referring provider will be notified of the test results.  I certify that I have reviewed the entire raw data recording prior to the issuance of this report in accordance with the Standards of Accreditation of the American Academy of Sleep Medicine (AASM)  Star Age, MD, PhD Diplomat, American Board of Neurology  and Sleep Medicine (Neurology and Sleep Medicine)

## 2019-10-09 NOTE — Telephone Encounter (Signed)
-----   Message from Star Age, MD sent at 10/09/2019  2:37 PM EST ----- Patient referred by Maximiano Coss, NP, seen by me on 07/17/19, HST on 08/27/19, CPAP titration study on 09/29/19.  Please call and inform patient that I have entered an order for treatment with positive airway pressure (PAP) treatment for obstructive sleep apnea (OSA). He did well during the latest sleep study with BiPAP ST. We will, therefore, arrange for a machine for home use through a DME (durable medical equipment) company of His choice; and I will see the patient back in follow-up in about 10 weeks. Please also explain to the patient that I will be looking out for compliance data, which can be downloaded from the machine (stored on an SD card, that is inserted in the machine) or via remote access through a modem, that is built into the machine. At the time of the followup appointment we will discuss sleep study results and how it is going with PAP treatment at home. Please advise patient to bring His machine at the time of the first FU visit, even though this is cumbersome. Bringing the machine for every visit after that will likely not be needed, but often helps for the first visit to troubleshoot if needed. Please re-enforce the importance of compliance with treatment and the need for [place] to monitor compliance data - often an insurance requirement and actually good feedback for the patient as far as how they are doing.  Also remind patient, that any interim PAP machine or mask issues should be first addressed with the DME company, as they can often help better with technical and mask fit issues. Please ask if patient has a preference regarding DME company.  Please also make sure, the patient has a follow-up appointment with me in about 10 weeks from the setup date, thanks. May see one of our nurse practitioners if needed for proper timing of the FU appointment.  Please fax or rout report to the referring provider. Thanks,   Star Age, MD, PhD [facility])

## 2019-10-09 NOTE — Telephone Encounter (Signed)
I called pt to discuss his sleep study results. No answer, left a message asking him to call me back. 

## 2019-10-13 NOTE — Telephone Encounter (Signed)
Pt returned my call. I advised pt that Dr. Rexene Alberts reviewed their sleep study results and found that pt did well with a bipap ST during his latest sleep study. Dr. Rexene Alberts recommends that pt start a bipap ST at home. I reviewed PAP compliance expectations with the pt. Pt is agreeable to starting a BiPAP. I advised pt that an order will be sent to a DME, [facility], and [facility] will call the pt within about one week after they file with the pt's insurance. [facility] will show the pt how to use the machine, fit for masks, and troubleshoot the BiPAP if needed. A follow up appt was made for insurance purposes with Jinny Blossom, NP on 01/06/20 at 1:00pm. Pt verbalized understanding to arrive 15 minutes early and bring their BiPAP. A letter with all of this information in it will be mailed to the pt as a reminder. I verified with the pt that the address we have on file is correct. Pt verbalized understanding of results. Pt had no questions at this time but was encouraged to call back if questions arise. I have sent the order to [facility] and have received confirmation that they have received the order.

## 2019-12-22 ENCOUNTER — Ambulatory Visit: Payer: No Typology Code available for payment source | Admitting: Registered Nurse

## 2019-12-23 ENCOUNTER — Encounter: Payer: Self-pay | Admitting: Registered Nurse

## 2020-01-06 ENCOUNTER — Ambulatory Visit: Payer: No Typology Code available for payment source | Admitting: Adult Health

## 2020-01-06 ENCOUNTER — Telehealth: Payer: Self-pay

## 2020-01-06 NOTE — Telephone Encounter (Signed)
Spoke with the patient and he stated that he is currently in the [place] due a family emergency coming up. He stated that he left his machine here in the [place]. He stated that he is unable to make his appt today with Jinny Blossom due to the airlines being on lockdown in the [place]. His appt has been r/s to April with Debbora Presto, NP. Patient is aware of the new appt date and time. No other question or concerns at this time.

## 2020-01-07 ENCOUNTER — Telehealth: Payer: Self-pay | Admitting: Registered Nurse

## 2020-01-07 ENCOUNTER — Other Ambulatory Visit: Payer: Self-pay

## 2020-01-07 DIAGNOSIS — I10 Essential (primary) hypertension: Secondary | ICD-10-CM

## 2020-01-07 MED ORDER — HYDROCHLOROTHIAZIDE 25 MG PO TABS: ORAL_TABLET | ORAL | 0 refills | Status: DC

## 2020-01-07 NOTE — Telephone Encounter (Signed)
Sent!

## 2020-01-07 NOTE — Telephone Encounter (Signed)
Pt needs hydrochlorothiazide (HYDRODIURIL) 25 MG tablet medication refilled. Please advise

## 2020-01-13 ENCOUNTER — Ambulatory Visit: Payer: No Typology Code available for payment source | Admitting: Registered Nurse

## 2020-02-07 ENCOUNTER — Other Ambulatory Visit: Payer: Self-pay | Admitting: Registered Nurse

## 2020-02-07 DIAGNOSIS — I10 Essential (primary) hypertension: Secondary | ICD-10-CM

## 2020-02-10 ENCOUNTER — Encounter: Payer: Self-pay | Admitting: Registered Nurse

## 2020-02-10 ENCOUNTER — Other Ambulatory Visit: Payer: Self-pay

## 2020-02-10 ENCOUNTER — Telehealth (INDEPENDENT_AMBULATORY_CARE_PROVIDER_SITE_OTHER): Payer: No Typology Code available for payment source | Admitting: Registered Nurse

## 2020-02-10 DIAGNOSIS — L299 Pruritus, unspecified: Secondary | ICD-10-CM

## 2020-02-10 DIAGNOSIS — I10 Essential (primary) hypertension: Secondary | ICD-10-CM | POA: Diagnosis not present

## 2020-02-10 MED ORDER — AMLODIPINE BESYLATE 10 MG PO TABS: 10.0000 mg | ORAL_TABLET | Freq: Every day | ORAL | 1 refills | Status: DC

## 2020-02-10 MED ORDER — HYDROXYZINE HCL 25 MG PO TABS: 25.0000 mg | ORAL_TABLET | Freq: Every evening | ORAL | 0 refills | Status: DC | PRN

## 2020-02-10 MED ORDER — OLMESARTAN MEDOXOMIL 5 MG PO TABS: 5.0000 mg | ORAL_TABLET | Freq: Every day | ORAL | 1 refills | Status: DC

## 2020-02-10 NOTE — Patient Instructions (Signed)
° ° ° °  If you have lab work done today you will be contacted with your lab results within the next 2 weeks.  If you have not heard from us then please contact us. The fastest way to get your results is to register for My Chart. ° ° °IF you received an x-ray today, you will receive an invoice from [facility]. Please contact [facility] at [phone] with questions or concerns regarding your invoice.  ° °IF you received labwork today, you will receive an invoice from [facility]. Please contact [facility] at [phone] with questions or concerns regarding your invoice.  ° °Our billing staff will not be able to assist you with questions regarding bills from these companies. ° °You will be contacted with the lab results as soon as they are available. The fastest way to get your results is to activate your My Chart account. Instructions are located on the last page of this paperwork. If you have not heard from us regarding the results in 2 weeks, please contact this office. °  ° ° ° °

## 2020-02-11 ENCOUNTER — Telehealth: Payer: Self-pay | Admitting: Registered Nurse

## 2020-02-11 MED ORDER — AMLODIPINE BESYLATE 10 MG PO TABS: 10.0000 mg | ORAL_TABLET | Freq: Every day | ORAL | 3 refills | Status: DC

## 2020-02-11 NOTE — Telephone Encounter (Signed)
Amlodipine 10 mg tablet . Was not received at the pharmacy . Pt calling to see if it can be resent , I will send cma direct message .

## 2020-02-19 NOTE — Progress Notes (Signed)
Telemedicine Encounter- SOAP NOTE Established Patient  This telephone encounter was conducted with the patient's (or proxy's) verbal consent via audio telecommunications: yes  Patient was instructed to have this encounter in a suitably private space; and to only have persons present to whom they give permission to participate. In addition, patient identity was confirmed by use of name plus two identifiers (DOB and address).  I discussed the limitations, risks, security and privacy concerns of performing an evaluation and management service by telephone and the availability of in person appointments. I also discussed with the patient that there may be a patient responsible charge related to this service. The patient expressed understanding and agreed to proceed.  I spent a total of 14 minutes talking with the patient or their proxy.  Chief Complaint  Patient presents with  . Dizziness    patient he got the covid vaccine on sunday and monday he was so dizzy and weak that he couldnt get out of bed. per patient  he feels a little better today  . Medication Refill    amlodipine    Subjective   Eric Obrien is a 54 y.o. established patient. Telephone visit today for med refill, vaccine side effects  HPI Pt reports that over the weekend he received the covid vaccine and woke up the next day very dizzy, sore, and nauseous. He rested, drank water, and stayed in bed most of the day He is feeling better today, has been back to work, and is optimistic this will continue, but wants to make sure that this is not of concern We discussed the biology behind the covid vaccine and that having a strong reaction like that, while unfortunate, is only a sign that the vaccine is working well.   Needs refill on amlodpine. Reports home bp has been good. Denies cv symptoms or concerns. Feels well overall besides for covid vaccine s/e  Patient Active Problem List   Diagnosis Date Noted  . Hyperlipidemia  11/04/2015  . Obesity (BMI 30-39.9) 11/04/2015  . Hypertensive heart disease without CHF     Past Medical History:  Diagnosis Date  . Hypertension     Current Outpatient Medications  Medication Sig Dispense Refill  . atorvastatin (LIPITOR) 40 MG tablet Take 1 tablet (40 mg total) by mouth daily. 90 tablet 1  . hydrochlorothiazide (HYDRODIURIL) 25 MG tablet TAKE 1 TABLET(25 MG) BY MOUTH DAILY FOR BLOOD PRESSURE 30 tablet 0  . metoprolol tartrate (LOPRESSOR) 25 MG tablet Take 1 tablet (25 mg total) by mouth 2 (two) times daily. 180 tablet 1  . Multiple Vitamin (MULTIVITAMIN WITH MINERALS) TABS tablet Take 1 tablet by mouth daily.    Marland Kitchen olmesartan (BENICAR) 5 MG tablet Take 1 tablet (5 mg total) by mouth daily. 90 tablet 1  . prednisoLONE acetate (PRED FORTE) 1 % ophthalmic suspension     . PREVIDENT 5000 SENSITIVE 1.1-5 % PSTE     . RESTASIS 0.05 % ophthalmic emulsion     . amLODipine (NORVASC) 10 MG tablet Take 1 tablet (10 mg total) by mouth daily. 90 tablet 3  . hydrOXYzine (ATARAX/VISTARIL) 25 MG tablet Take 1 tablet (25 mg total) by mouth at bedtime as needed for itching. 90 tablet 0   No current facility-administered medications for this visit.    No Known Allergies  Social History   Socioeconomic History  . Marital status: Married    Spouse name: Not on file  . Number of children: 2  . Years of education:  Not on file  . Highest education level: Not on file  Occupational History  . Occupation: [employer]  Tobacco Use  . Smoking status: Never Smoker  . Smokeless tobacco: Never Used  Substance and Sexual Activity  . Alcohol use: No    Comment: sometimes  . Drug use: No  . Sexual activity: Not Currently  Other Topics Concern  . Not on file  Social History Narrative  . Not on file   Social Determinants of Health   Financial Resource Strain: Low Risk   . Difficulty of Paying Living Expenses: Not hard at all  Food Insecurity: No Food Insecurity  . Worried About Paediatric nurse in the Last Year: Never true  . Ran Out of Food in the Last Year: Never true  Transportation Needs: No Transportation Needs  . Lack of Transportation (Medical): No  . Lack of Transportation (Non-Medical): No  Physical Activity: Sufficiently Active  . Days of Exercise per Week: 5 days  . Minutes of Exercise per Session: 50 min  Stress: Stress Concern Present  . Feeling of Stress : To some extent  Social Connections: Unknown  . Frequency of Communication with Friends and Family: Three times a week  . Frequency of Social Gatherings with Friends and Family: Twice a week  . Attends Religious Services: Patient refused  . Active Member of Clubs or Organizations: Patient refused  . Attends Archivist Meetings: Patient refused  . Marital Status: Patient refused  Intimate Partner Violence: Not At Risk  . Fear of Current or Ex-Partner: No  . Emotionally Abused: No  . Physically Abused: No  . Sexually Abused: No    ROS Per hpi   Objective   Vitals as reported by the patient: There were no vitals filed for this visit.  [name] was seen today for dizziness and medication refill.  Diagnoses and all orders for this visit:  Itching -     hydrOXYzine (ATARAX/VISTARIL) 25 MG tablet; Take 1 tablet (25 mg total) by mouth at bedtime as needed for itching.  Essential hypertension -     Discontinue: amLODipine (NORVASC) 10 MG tablet; Take 1 tablet (10 mg total) by mouth daily. -     olmesartan (BENICAR) 5 MG tablet; Take 1 tablet (5 mg total) by mouth daily. -     amLODipine (NORVASC) 10 MG tablet; Take 1 tablet (10 mg total) by mouth daily.   PLAN  Given hydroxyzine for upcoming second injection  Add olmesartan 5mg  PO qd to amlodipine 10mg  PO qd  Return in 3-6 mo for in clinic bp check  Patient encouraged to call clinic with any questions, comments, or concerns.   I discussed the assessment and treatment plan with the patient. The patient was provided an  opportunity to ask questions and all were answered. The patient agreed with the plan and demonstrated an understanding of the instructions.   The patient was advised to call back or seek an in-person evaluation if the symptoms worsen or if the condition fails to improve as anticipated.  I provided 14 minutes of non-face-to-face time during this encounter.  Maximiano Coss, NP  Primary Care at [facility]

## 2020-03-01 ENCOUNTER — Ambulatory Visit: Payer: No Typology Code available for payment source | Admitting: Family Medicine

## 2020-10-15 ENCOUNTER — Ambulatory Visit: Payer: PRIVATE HEALTH INSURANCE | Attending: Internal Medicine

## 2020-10-15 DIAGNOSIS — Z23 Encounter for immunization: Secondary | ICD-10-CM

## 2020-10-15 NOTE — Progress Notes (Signed)
   Covid-19 Vaccination Clinic  Name:  Eric Obrien    MRN: [id number] DOB: 1966/07/01  10/15/2020  [name] was observed post Covid-19 immunization for 15 minutes without incident. He was provided with Vaccine Information Sheet and instruction to access the V-Safe system.   [name] was instructed to call 911 with any severe reactions post vaccine: Marland Kitchen Difficulty breathing  . Swelling of face and throat  . A fast heartbeat  . A bad rash all over body  . Dizziness and weakness   Immunizations Administered    Name Date Dose VIS Date Route   Pfizer COVID-19 Vaccine 10/15/2020  4:02 PM 0.3 mL 09/15/2020 Intramuscular   Manufacturer: [place]   Lot: [id number]   [place]: [id number]

## 2020-11-03 ENCOUNTER — Ambulatory Visit: Payer: 59 | Admitting: Registered Nurse

## 2020-11-10 ENCOUNTER — Ambulatory Visit: Payer: 59 | Admitting: Registered Nurse

## 2021-01-31 ENCOUNTER — Encounter: Payer: Self-pay | Admitting: Registered Nurse

## 2021-01-31 ENCOUNTER — Ambulatory Visit (INDEPENDENT_AMBULATORY_CARE_PROVIDER_SITE_OTHER): Payer: 59 | Admitting: Registered Nurse

## 2021-01-31 ENCOUNTER — Other Ambulatory Visit: Payer: Self-pay

## 2021-01-31 VITALS — BP 186/106 | HR 98 | Temp 97.8°F | Resp 18 | Ht 65.0 in | Wt 204.2 lb

## 2021-01-31 DIAGNOSIS — Z1211 Encounter for screening for malignant neoplasm of colon: Secondary | ICD-10-CM

## 2021-01-31 DIAGNOSIS — I10 Essential (primary) hypertension: Secondary | ICD-10-CM | POA: Diagnosis not present

## 2021-01-31 DIAGNOSIS — L299 Pruritus, unspecified: Secondary | ICD-10-CM | POA: Diagnosis not present

## 2021-01-31 DIAGNOSIS — M2550 Pain in unspecified joint: Secondary | ICD-10-CM | POA: Diagnosis not present

## 2021-01-31 MED ORDER — HYDROXYZINE HCL 10 MG PO TABS: 10.0000 mg | ORAL_TABLET | Freq: Three times a day (TID) | ORAL | 0 refills | Status: DC | PRN

## 2021-01-31 MED ORDER — TRIAMCINOLONE ACETONIDE 0.1 % EX CREA: 1.0000 "application " | TOPICAL_CREAM | Freq: Two times a day (BID) | CUTANEOUS | 0 refills | Status: DC

## 2021-01-31 MED ORDER — TRAMADOL HCL 50 MG PO TABS: 50.0000 mg | ORAL_TABLET | Freq: Three times a day (TID) | ORAL | 0 refills | Status: AC | PRN

## 2021-01-31 MED ORDER — MELOXICAM 7.5 MG PO TABS: 7.5000 mg | ORAL_TABLET | Freq: Every day | ORAL | 0 refills | Status: DC

## 2021-01-31 MED ORDER — AMLODIPINE BESYLATE 10 MG PO TABS: 10.0000 mg | ORAL_TABLET | Freq: Every day | ORAL | 3 refills | Status: DC

## 2021-01-31 NOTE — Progress Notes (Signed)
Acute Office Visit  Subjective:    Patient ID: Eric Obrien, male    DOB: 17-Jan-1966, 55 y.o.   MRN: [id number]  Chief Complaint  Patient presents with  . Joint Pain    Patient states starting yesterday he has been having some pain in his joints and also unable to sleep. Per patient he feels very tired today.    HPI Patient is in today for diffuse pain Worst in elbows and knees No injury No other symptoms despite thorough ROS. Has not happened before No fam hx of autoimmune processes Has been working nearly every day for 2+ years, usually 10-12+ hours daily Very fatigued Does have some itching in upper back, central. No rash. No new products Works out regularly, diet is so-so  Past Medical History:  Diagnosis Date  . Hypertension     History reviewed. No pertinent surgical history.  History reviewed. No pertinent family history.  Social History   Socioeconomic History  . Marital status: Married    Spouse name: Not on file  . Number of children: 2  . Years of education: Not on file  . Highest education level: Not on file  Occupational History  . Occupation: [employer]  Tobacco Use  . Smoking status: Never Smoker  . Smokeless tobacco: Never Used  Vaping Use  . Vaping Use: Never used  Substance and Sexual Activity  . Alcohol use: No    Comment: sometimes  . Drug use: No  . Sexual activity: Not Currently  Other Topics Concern  . Not on file  Social History Narrative  . Not on file   Social Determinants of Health   Financial Resource Strain: Not on file  Food Insecurity: Not on file  Transportation Needs: Not on file  Physical Activity: Not on file  Stress: Not on file  Social Connections: Not on file  Intimate Partner Violence: Not on file    Outpatient Medications Prior to Visit  Medication Sig Dispense Refill  . Multiple Vitamin (MULTIVITAMIN WITH MINERALS) TABS tablet Take 1 tablet by mouth daily.    Marland Kitchen atorvastatin (LIPITOR) 40 MG tablet Take 1  tablet (40 mg total) by mouth daily. (Patient not taking: Reported on 01/31/2021) 90 tablet 1  . hydrochlorothiazide (HYDRODIURIL) 25 MG tablet TAKE 1 TABLET(25 MG) BY MOUTH DAILY FOR BLOOD PRESSURE (Patient not taking: Reported on 01/31/2021) 30 tablet 0  . metoprolol tartrate (LOPRESSOR) 25 MG tablet Take 1 tablet (25 mg total) by mouth 2 (two) times daily. (Patient not taking: Reported on 01/31/2021) 180 tablet 1  . olmesartan (BENICAR) 5 MG tablet Take 1 tablet (5 mg total) by mouth daily. (Patient not taking: Reported on 01/31/2021) 90 tablet 1  . prednisoLONE acetate (PRED FORTE) 1 % ophthalmic suspension  (Patient not taking: Reported on 01/31/2021)    . PREVIDENT 5000 SENSITIVE 1.1-5 % PSTE  (Patient not taking: Reported on 01/31/2021)    . RESTASIS 0.05 % ophthalmic emulsion  (Patient not taking: Reported on 01/31/2021)    . amLODipine (NORVASC) 10 MG tablet Take 1 tablet (10 mg total) by mouth daily. (Patient not taking: Reported on 01/31/2021) 90 tablet 3  . hydrOXYzine (ATARAX/VISTARIL) 25 MG tablet Take 1 tablet (25 mg total) by mouth at bedtime as needed for itching. (Patient not taking: Reported on 01/31/2021) 90 tablet 0   No facility-administered medications prior to visit.    No Known Allergies  Review of Systems  Constitutional: Negative.   HENT: Negative.   Eyes: Negative.  Respiratory: Negative.   Cardiovascular: Negative.   Gastrointestinal: Negative.   Endocrine: Negative.   Genitourinary: Negative.   Musculoskeletal: Positive for arthralgias and myalgias.  Skin: Negative.  Negative for rash and wound.  Allergic/Immunologic: Negative.   Neurological: Negative.   Hematological: Negative.   Psychiatric/Behavioral: Negative.        Objective:    Physical Exam Vitals and nursing note reviewed.  Constitutional:      Appearance: Normal appearance.  Cardiovascular:     Rate and Rhythm: Normal rate and regular rhythm.     Pulses: Normal pulses.     Heart sounds: Normal heart  sounds. No murmur heard. No friction rub. No gallop.   Pulmonary:     Effort: Pulmonary effort is normal. No respiratory distress.     Breath sounds: Normal breath sounds. No stridor. No wheezing, rhonchi or rales.  Neurological:     General: No focal deficit present.     Mental Status: He is alert. Mental status is at baseline.  Psychiatric:        Mood and Affect: Mood normal.        Behavior: Behavior normal.        Thought Content: Thought content normal.        Judgment: Judgment normal.     BP (!) 186/106   Pulse 98   Temp 97.8 F (36.6 C) (Temporal)   Resp 18   Ht 5\' 5"  (1.651 m)   Wt 204 lb 3.2 oz (92.6 kg)   SpO2 97%   BMI 33.98 kg/m  Wt Readings from Last 3 Encounters:  01/31/21 204 lb 3.2 oz (92.6 kg)  07/17/19 209 lb (94.8 kg)  06/20/19 209 lb (94.8 kg)    Health Maintenance Due  Topic Date Due  . COLONOSCOPY (Pts 45-51yrs Insurance coverage will need to be confirmed)  Never done    There are no preventive care reminders to display for this patient.   Lab Results  Component Value Date   TSH 1.660 09/27/2018   Lab Results  Component Value Date   WBC 8.4 09/27/2018   HGB 14.2 09/27/2018   HCT 40.1 09/27/2018   MCV 80 09/27/2018   PLT 284 09/27/2018   Lab Results  Component Value Date   NA 140 09/27/2018   K 3.3 (L) 09/27/2018   CO2 28 09/27/2018   GLUCOSE 86 09/27/2018   BUN 14 09/27/2018   CREATININE 1.13 09/27/2018   BILITOT 0.5 09/27/2018   ALKPHOS 79 09/27/2018   AST 32 09/27/2018   ALT 29 09/27/2018   PROT 7.7 09/27/2018   ALBUMIN 4.5 09/27/2018   CALCIUM 9.5 09/27/2018   Lab Results  Component Value Date   CHOL 162 09/27/2018   Lab Results  Component Value Date   HDL 40 09/27/2018   Lab Results  Component Value Date   LDLCALC 103 (H) 09/27/2018   Lab Results  Component Value Date   TRIG 97 09/27/2018   Lab Results  Component Value Date   CHOLHDL 4.1 09/27/2018   No results found for: HGBA1C     Assessment &  Plan:   Problem List Items Addressed This Visit   None   Visit Diagnoses    Polyarthralgia    -  Primary   Relevant Medications   meloxicam (MOBIC) 7.5 MG tablet   traMADol (ULTRAM) 50 MG tablet   Other Relevant Orders   Uric Acid   Comprehensive metabolic panel   ANA w/Reflex   C-reactive protein  Sedimentation Rate   CBC With Differential   Essential hypertension       Relevant Medications   amLODipine (NORVASC) 10 MG tablet   Screen for colon cancer       Relevant Orders   Ambulatory referral to Gastroenterology   Itch       Relevant Medications   hydrOXYzine (ATARAX/VISTARIL) 10 MG tablet   triamcinolone (KENALOG) 0.1 %       Meds ordered this encounter  Medications  . amLODipine (NORVASC) 10 MG tablet    Sig: Take 1 tablet (10 mg total) by mouth daily.    Dispense:  90 tablet    Refill:  3    Order Specific Question:   Supervising Provider    Answer:   Carlota Raspberry, JEFFREY R [[id number]]  . meloxicam (MOBIC) 7.5 MG tablet    Sig: Take 1 tablet (7.5 mg total) by mouth daily.    Dispense:  30 tablet    Refill:  0    Order Specific Question:   Supervising Provider    Answer:   Carlota Raspberry, JEFFREY R [[id number]]  . traMADol (ULTRAM) 50 MG tablet    Sig: Take 1 tablet (50 mg total) by mouth every 8 (eight) hours as needed for up to 5 days.    Dispense:  15 tablet    Refill:  0    Order Specific Question:   Supervising Provider    Answer:   Carlota Raspberry, JEFFREY R [[id number]]  . hydrOXYzine (ATARAX/VISTARIL) 10 MG tablet    Sig: Take 1 tablet (10 mg total) by mouth 3 (three) times daily as needed.    Dispense:  30 tablet    Refill:  0    Order Specific Question:   Supervising Provider    Answer:   Carlota Raspberry, JEFFREY R [[id number]]  . triamcinolone (KENALOG) 0.1 %    Sig: Apply 1 application topically 2 (two) times daily.    Dispense:  30 g    Refill:  0    Order Specific Question:   Supervising Provider    Answer:   Carlota Raspberry, JEFFREY R [[id number]]    PLAN  Restart amlodipine as his bp is very high  today.  meloxicam for pain. Tramadol for breakthrough pain  Hydroxyzine and triamcinolone for itching  Labs collected. Will follow up with the patient as warranted.  Work note given for today and tomorrow. May extend if symptoms worsen or fail to improve  Patient encouraged to call clinic with any questions, comments, or concerns.  Maximiano Coss, NP

## 2021-01-31 NOTE — Patient Instructions (Signed)
° ° ° °  If you have lab work done today you will be contacted with your lab results within the next 2 weeks.  If you have not heard from us then please contact us. The fastest way to get your results is to register for My Chart. ° ° °IF you received an x-ray today, you will receive an invoice from [facility]. Please contact [facility] at [phone] with questions or concerns regarding your invoice.  ° °IF you received labwork today, you will receive an invoice from [facility]. Please contact [facility] at [phone] with questions or concerns regarding your invoice.  ° °Our billing staff will not be able to assist you with questions regarding bills from these companies. ° °You will be contacted with the lab results as soon as they are available. The fastest way to get your results is to activate your My Chart account. Instructions are located on the last page of this paperwork. If you have not heard from us regarding the results in 2 weeks, please contact this office. °  ° ° ° °

## 2021-02-01 LAB — CBC WITH DIFFERENTIAL
Basophils Absolute: 0.1 10*3/uL (ref 0.0–0.2)
Basos: 1 %
EOS (ABSOLUTE): 0.2 10*3/uL (ref 0.0–0.4)
Eos: 3 %
Hematocrit: 41.7 % (ref 37.5–51.0)
Hemoglobin: 13.7 g/dL (ref 13.0–17.7)
Immature Grans (Abs): 0 10*3/uL (ref 0.0–0.1)
Immature Granulocytes: 0 %
Lymphocytes Absolute: 3.3 10*3/uL — ABNORMAL HIGH (ref 0.7–3.1)
Lymphs: 44 %
MCH: 27.6 pg (ref 26.6–33.0)
MCHC: 32.9 g/dL (ref 31.5–35.7)
MCV: 84 fL (ref 79–97)
Monocytes Absolute: 0.4 10*3/uL (ref 0.1–0.9)
Monocytes: 6 %
Neutrophils Absolute: 3.5 10*3/uL (ref 1.4–7.0)
Neutrophils: 46 %
RBC: 4.96 x10E6/uL (ref 4.14–5.80)
RDW: 12.8 % (ref 11.6–15.4)
WBC: 7.5 10*3/uL (ref 3.4–10.8)

## 2021-02-01 LAB — COMPREHENSIVE METABOLIC PANEL
ALT: 22 IU/L (ref 0–44)
AST: 28 IU/L (ref 0–40)
Albumin/Globulin Ratio: 1.6 (ref 1.2–2.2)
Albumin: 4.4 g/dL (ref 3.8–4.9)
Alkaline Phosphatase: 122 IU/L — ABNORMAL HIGH (ref 44–121)
BUN/Creatinine Ratio: 13 (ref 9–20)
BUN: 15 mg/dL (ref 6–24)
Bilirubin Total: 0.3 mg/dL (ref 0.0–1.2)
CO2: 24 mmol/L (ref 20–29)
Calcium: 9.5 mg/dL (ref 8.7–10.2)
Chloride: 101 mmol/L (ref 96–106)
Creatinine, Ser: 1.18 mg/dL (ref 0.76–1.27)
Globulin, Total: 2.8 g/dL (ref 1.5–4.5)
Glucose: 141 mg/dL — ABNORMAL HIGH (ref 65–99)
Potassium: 3.5 mmol/L (ref 3.5–5.2)
Sodium: 141 mmol/L (ref 134–144)
Total Protein: 7.2 g/dL (ref 6.0–8.5)
eGFR: 74 mL/min/{1.73_m2} (ref >59)

## 2021-02-01 LAB — C-REACTIVE PROTEIN: CRP: 5 mg/L (ref 0–10)

## 2021-02-01 LAB — SEDIMENTATION RATE: Sed Rate: 22 mm/hr (ref 0–30)

## 2021-02-01 LAB — URIC ACID: Uric Acid: 6 mg/dL (ref 3.8–8.4)

## 2021-02-01 LAB — ANA W/REFLEX: Anti Nuclear Antibody (ANA): NEGATIVE

## 2021-05-18 ENCOUNTER — Telehealth: Payer: Self-pay

## 2021-05-18 NOTE — Telephone Encounter (Signed)
Pt NS for PV.  Attempted to reach by cell phone-mail box phone and home phone.  Message left to call back by 5:00 pm today 6/22 to prevent cancellation of scheduled procedure.

## 2021-05-20 ENCOUNTER — Ambulatory Visit (AMBULATORY_SURGERY_CENTER): Payer: 59

## 2021-05-20 ENCOUNTER — Other Ambulatory Visit: Payer: Self-pay

## 2021-05-20 VITALS — Ht 64.0 in | Wt 185.0 lb

## 2021-05-20 DIAGNOSIS — Z1211 Encounter for screening for malignant neoplasm of colon: Secondary | ICD-10-CM

## 2021-05-20 MED ORDER — NA SULFATE-K SULFATE-MG SULF 17.5-3.13-1.6 GM/177ML PO SOLN: 1.0000 | Freq: Once | ORAL | 0 refills | Status: AC

## 2021-05-20 NOTE — Progress Notes (Signed)
Pre visit completed via phone call; patient verified name, DOB, and address;  No egg or soy allergy known to patient  No issues with past sedation with any surgeries or procedures Patient denies ever being told they had issues or difficulty with intubation  No FH of Malignant Hyperthermia No diet pills per patient No home 02 use per patient  No blood thinners per patient  Pt denies issues with constipation  No A fib or A flutter  EMMI video via MyChart  COVID 19 guidelines implemented in PV today with Pt and RN  NO PA's for preps discussed with pt in PV today  Discussed with pt there will be an out-of-pocket cost for prep and that varies from $0 to 70 dollars   Due to the COVID-19 pandemic we are asking patients to follow certain guidelines.  Pt aware of COVID protocols and [facility] guidelines

## 2021-06-01 ENCOUNTER — Encounter: Payer: 59 | Admitting: Gastroenterology

## 2021-06-01 ENCOUNTER — Telehealth: Payer: Self-pay | Admitting: Gastroenterology

## 2021-06-09 ENCOUNTER — Telehealth: Payer: Self-pay | Admitting: Gastroenterology

## 2021-06-09 NOTE — Telephone Encounter (Signed)
Returned pts call with no answer.  LM on VM for pt to call back.

## 2021-06-09 NOTE — Telephone Encounter (Signed)
PV 6/24[

## 2021-06-09 NOTE — Telephone Encounter (Signed)
Called patient to discuss prep and side effects. I advised him to drink broth and to try some gatorade since he felt weak. Then he complained of nausea and vomiting. I asked if he had any zofran at home and he said yes, advised him to take one of those as well. He agreed and then said he felt so bad he would just like to reschedule until he felt better. I made him a PV appointment as well as rescheduled him for 8/22. Patient was pleased and had no further concerns.

## 2021-06-09 NOTE — Telephone Encounter (Signed)
Inbound call from patient. He have procedure for 7/15 but states he have had diarrhea from the prep. Would like a call about if he should cancel procedure for tomorrow because he feels weak. Best contact number [phone]

## 2021-06-10 ENCOUNTER — Encounter: Payer: 59 | Admitting: Gastroenterology

## 2021-07-06 ENCOUNTER — Ambulatory Visit (AMBULATORY_SURGERY_CENTER): Payer: Self-pay

## 2021-07-06 ENCOUNTER — Other Ambulatory Visit: Payer: Self-pay

## 2021-07-06 VITALS — Ht 65.0 in | Wt 209.0 lb

## 2021-07-06 DIAGNOSIS — Z1211 Encounter for screening for malignant neoplasm of colon: Secondary | ICD-10-CM

## 2021-07-06 MED ORDER — ONDANSETRON HCL 4 MG PO TABS: 4.0000 mg | ORAL_TABLET | ORAL | 0 refills | Status: DC

## 2021-07-06 NOTE — Progress Notes (Signed)
No allergies to soy or egg Pt is not on blood thinners or diet pills Denies issues with sedation/intubation--has never had procedures or colonoscopy Denies atrial flutter/fib Denies constipation    Pt is aware of Covid safety and care partner requirements.   Zofan called in for patient.  Also already has suprep at home.

## 2021-07-18 ENCOUNTER — Encounter: Payer: Self-pay | Admitting: Gastroenterology

## 2021-07-18 ENCOUNTER — Other Ambulatory Visit: Payer: Self-pay

## 2021-07-18 ENCOUNTER — Ambulatory Visit (AMBULATORY_SURGERY_CENTER): Payer: 59 | Admitting: Gastroenterology

## 2021-07-18 VITALS — BP 157/96 | HR 75 | Temp 99.3°F | Resp 17 | Ht 65.0 in | Wt 209.0 lb

## 2021-07-18 DIAGNOSIS — D128 Benign neoplasm of rectum: Secondary | ICD-10-CM

## 2021-07-18 DIAGNOSIS — D123 Benign neoplasm of transverse colon: Secondary | ICD-10-CM | POA: Diagnosis not present

## 2021-07-18 DIAGNOSIS — Z1211 Encounter for screening for malignant neoplasm of colon: Secondary | ICD-10-CM | POA: Diagnosis not present

## 2021-07-18 DIAGNOSIS — K621 Rectal polyp: Secondary | ICD-10-CM

## 2021-07-18 DIAGNOSIS — D122 Benign neoplasm of ascending colon: Secondary | ICD-10-CM

## 2021-07-18 MED ORDER — SODIUM CHLORIDE 0.9 % IV SOLN: 500.0000 mL | Freq: Once | INTRAVENOUS | Status: DC

## 2021-07-18 NOTE — Progress Notes (Signed)
Report to PACU, RN, vss, BBS= Clear.  

## 2021-07-18 NOTE — Progress Notes (Signed)
Called to room to assist during endoscopic procedure.  Patient ID and intended procedure confirmed with present staff. Received instructions for my participation in the procedure from the performing physician.  

## 2021-07-18 NOTE — Patient Instructions (Signed)
Handout given:  polyps, diverticulosis, hemorrhoids Resume previous diet Continue current medications Await pathology results  YOU HAD AN ENDOSCOPIC PROCEDURE TODAY AT [facility]:   Refer to the procedure report that was given to you for any specific questions about what was found during the examination.  If the procedure report does not answer your questions, please call your gastroenterologist to clarify.  If you requested that your care partner not be given the details of your procedure findings, then the procedure report has been included in a sealed envelope for you to review at your convenience later.  YOU SHOULD EXPECT: Some feelings of bloating in the abdomen. Passage of more gas than usual.  Walking can help get rid of the air that was put into your GI tract during the procedure and reduce the bloating. If you had a lower endoscopy (such as a colonoscopy or flexible sigmoidoscopy) you may notice spotting of blood in your stool or on the toilet paper. If you underwent a bowel prep for your procedure, you may not have a normal bowel movement for a few days.  Please Note:  You might notice some irritation and congestion in your nose or some drainage.  This is from the oxygen used during your procedure.  There is no need for concern and it should clear up in a day or so.  SYMPTOMS TO REPORT IMMEDIATELY:  Following lower endoscopy (colonoscopy or flexible sigmoidoscopy):  Excessive amounts of blood in the stool  Significant tenderness or worsening of abdominal pains  Swelling of the abdomen that is new, acute  Fever of 100F or higher For urgent or emergent issues, a gastroenterologist can be reached at any hour by calling [phone]. Do not use MyChart messaging for urgent concerns.   DIET:  We do recommend a small meal at first, but then you may proceed to your regular diet.  Drink plenty of fluids but you should avoid alcoholic beverages for 24 hours.  ACTIVITY:   You should plan to take it easy for the rest of today and you should NOT DRIVE or use heavy machinery until tomorrow (because of the sedation medicines used during the test).    FOLLOW UP: Our staff will call the number listed on your records 48-72 hours following your procedure to check on you and address any questions or concerns that you may have regarding the information given to you following your procedure. If we do not reach you, we will leave a message.  We will attempt to reach you two times.  During this call, we will ask if you have developed any symptoms of COVID 19. If you develop any symptoms (ie: fever, flu-like symptoms, shortness of breath, cough etc.) before then, please call [phone].  If you test positive for Covid 19 in the 2 weeks post procedure, please call and report this information to [place].    If any biopsies were taken you will be contacted by phone or by letter within the next 1-3 weeks.  Please call us at [phone] if you have not heard about the biopsies in 3 weeks.   SIGNATURES/CONFIDENTIALITY: You and/or your care partner have signed paperwork which will be entered into your electronic medical record.  These signatures attest to the fact that that the information above on your After Visit Summary has been reviewed and is understood.  Full responsibility of the confidentiality of this discharge information lies with you and/or your care-partner.

## 2021-07-18 NOTE — Op Note (Signed)
[facility] Patient Name: Eric Obrien Procedure Date: 07/18/2021 2:13 PM MRN: [id number] Endoscopist: Remo Lipps P. Havery Moros , MD Age: 55 Referring MD:  Date of Birth: 27-May-1966 Gender: Male Account #: [id number] Procedure:                Colonoscopy Indications:              Screening for colorectal malignant neoplasm, This                            is the patient's first colonoscopy Medicines:                Monitored Anesthesia Care Procedure:                Pre-Anesthesia Assessment:                           - Prior to the procedure, a History and Physical                            was performed, and patient medications and                            allergies were reviewed. The patient's tolerance of                            previous anesthesia was also reviewed. The risks                            and benefits of the procedure and the sedation                            options and risks were discussed with the patient.                            All questions were answered, and informed consent                            was obtained. Prior Anticoagulants: The patient has                            taken no previous anticoagulant or antiplatelet                            agents. ASA Grade Assessment: II - A patient with                            mild systemic disease. After reviewing the risks                            and benefits, the patient was deemed in                            satisfactory condition to undergo the procedure.  After obtaining informed consent, the colonoscope                            was passed under direct vision. Throughout the                            procedure, the patient's blood pressure, pulse, and                            oxygen saturations were monitored continuously. The                            CF [id number] was introduced through the anus                            and advanced to the  the cecum, identified by                            appendiceal orifice and ileocecal valve. The                            colonoscopy was performed without difficulty. The                            patient tolerated the procedure well. The quality                            of the bowel preparation was good. The ileocecal                            valve, appendiceal orifice, and rectum were                            photographed. Scope In: 2:28:10 PM Scope Out: 2:43:15 PM Scope Withdrawal Time: 0 hours 11 minutes 55 seconds  Total Procedure Duration: 0 hours 15 minutes 5 seconds  Findings:                 The perianal and digital rectal examinations were                            normal.                           A 7 mm polyp was found in the ascending colon. The                            polyp was sessile. The polyp was removed with a                            cold snare. Resection and retrieval were complete.                           A 4 mm polyp was found in the transverse colon. The  polyp was sessile. The polyp was removed with a                            cold snare. Resection and retrieval were complete.                           Scattered medium-mouthed diverticula were found in                            the entire colon.                           A 3 mm polyp was found in the distal rectum                            approximating the dentate line. The polyp was                            sessile. The polyp was removed with a cold snare.                            Resection and retrieval were complete.                           Internal hemorrhoids were found during retroflexion.                           The exam was otherwise without abnormality. Complications:            No immediate complications. Estimated blood loss:                            Minimal. Estimated Blood Loss:     Estimated blood loss was minimal. Impression:                - One 7 mm polyp in the ascending colon, removed                            with a cold snare. Resected and retrieved.                           - One 4 mm polyp in the transverse colon, removed                            with a cold snare. Resected and retrieved.                           - Diverticulosis in the entire examined colon.                           - One 3 mm polyp in the distal rectum, removed with                            a cold snare. Resected and retrieved.                           -  Internal hemorrhoids.                           - The examination was otherwise normal. Recommendation:           - Patient has a contact number available for                            emergencies. The signs and symptoms of potential                            delayed complications were discussed with the                            patient. Return to normal activities tomorrow.                            Written discharge instructions were provided to the                            patient.                           - Resume previous diet.                           - Continue present medications.                           - Await pathology results. Remo Lipps P. Havery Moros, MD 07/18/2021 2:51:30 PM This report has been signed electronically.

## 2021-07-18 NOTE — Progress Notes (Signed)
Pt's states no medical or surgical changes since previsit or office visit.   AS - VS

## 2021-07-18 NOTE — Progress Notes (Signed)
[facility] History and Physical   Primary Care Physician:  Maximiano Coss, NP   Reason for Procedure:   Colon cancer screening  Plan:    colonoscopy     HPI: Eric Obrien is a 55 y.o. male here for first time colon cancer screening / colonoscopy. He denies any problems with his bowels. No FH of colon cancer. Otherwise feels well without complaints.   Past Medical History:  Diagnosis Date   Hyperlipidemia    on meds   Hypertension    on meds   Sleep apnea    uses CPAP    Past Surgical History:  Procedure Laterality Date   NO PAST SURGERIES      Prior to Admission medications   Medication Sig Start Date End Date Taking? Authorizing Provider  amLODipine (NORVASC) 10 MG tablet Take 1 tablet (10 mg total) by mouth daily. 01/31/21   Maximiano Coss, NP  atorvastatin (LIPITOR) 40 MG tablet Take 1 tablet (40 mg total) by mouth daily. 06/20/19   Maximiano Coss, NP  hydrochlorothiazide (HYDRODIURIL) 25 MG tablet TAKE 1 TABLET(25 MG) BY MOUTH DAILY FOR BLOOD PRESSURE Patient not taking: No sig reported 01/07/20   Maximiano Coss, NP  hydrOXYzine (ATARAX/VISTARIL) 10 MG tablet Take 1 tablet (10 mg total) by mouth 3 (three) times daily as needed. Patient not taking: No sig reported 01/31/21   Maximiano Coss, NP  Multiple Vitamin (MULTIVITAMIN WITH MINERALS) TABS tablet Take 1 tablet by mouth daily.    [provider]  ondansetron (ZOFRAN) 4 MG tablet Take 1 tablet (4 mg total) by mouth as directed. 1 table 30-60 minutes prior to each prep dose. 07/06/21   Shalynn Jorstad, Carlota Raspberry, MD  prednisoLONE acetate (PRED FORTE) 1 % ophthalmic suspension  02/06/19   [provider]  RESTASIS 0.05 % ophthalmic emulsion  06/13/19   [provider]  triamcinolone (KENALOG) 0.1 % Apply 1 application topically 2 (two) times daily. 01/31/21   Maximiano Coss, NP    Current Outpatient Medications  Medication Sig Dispense Refill   amLODipine (NORVASC) 10 MG tablet Take 1  tablet (10 mg total) by mouth daily. 90 tablet 3   atorvastatin (LIPITOR) 40 MG tablet Take 1 tablet (40 mg total) by mouth daily. 90 tablet 1   hydrochlorothiazide (HYDRODIURIL) 25 MG tablet TAKE 1 TABLET(25 MG) BY MOUTH DAILY FOR BLOOD PRESSURE (Patient not taking: No sig reported) 30 tablet 0   hydrOXYzine (ATARAX/VISTARIL) 10 MG tablet Take 1 tablet (10 mg total) by mouth 3 (three) times daily as needed. (Patient not taking: No sig reported) 30 tablet 0   Multiple Vitamin (MULTIVITAMIN WITH MINERALS) TABS tablet Take 1 tablet by mouth daily.     ondansetron (ZOFRAN) 4 MG tablet Take 1 tablet (4 mg total) by mouth as directed. 1 table 30-60 minutes prior to each prep dose. 2 tablet 0   prednisoLONE acetate (PRED FORTE) 1 % ophthalmic suspension      RESTASIS 0.05 % ophthalmic emulsion      triamcinolone (KENALOG) 0.1 % Apply 1 application topically 2 (two) times daily. 30 g 0   Current Facility-Administered Medications  Medication Dose Route Frequency Provider Last Rate Last Admin   0.9 %  sodium chloride infusion  500 mL Intravenous Once Joud Pettinato, Carlota Raspberry, MD        Allergies as of 07/18/2021   (No Known Allergies)    Family History  Problem Relation Age of Onset   Crohn's disease Other    Colon cancer Neg  Hx    Esophageal cancer Neg Hx    Colon polyps Neg Hx    Stomach cancer Neg Hx    Rectal cancer Neg Hx     Social History   Socioeconomic History   Marital status: Married    Spouse name: Not on file   Number of children: 2   Years of education: Not on file   Highest education level: Not on file  Occupational History   Occupation: [employer]  Tobacco Use   Smoking status: Never   Smokeless tobacco: Never  Vaping Use   Vaping Use: Never used  Substance and Sexual Activity   Alcohol use: Not Currently    Alcohol/week: 2.0 standard drinks    Types: 2 Standard drinks or equivalent per week    Comment: rarely   Drug use: No   Sexual activity: Not Currently  Other  Topics Concern   Not on file  Social History Narrative   Not on file   Social Determinants of Health   Financial Resource Strain: Not on file  Food Insecurity: Not on file  Transportation Needs: Not on file  Physical Activity: Not on file  Stress: Not on file  Social Connections: Not on file  Intimate Partner Violence: Not on file    Review of Systems: All other review of systems negative except as mentioned in the HPI.  Physical Exam: Vital signs BP (!) 175/113   Pulse 70   Temp 99.3 F (37.4 C)   Resp 15   Ht '5\' 5"'$  (1.651 m)   Wt 209 lb (94.8 kg)   SpO2 100%   BMI 34.78 kg/m   General:   Alert,  Well-developed, well-nourished, pleasant and cooperative in NAD Lungs:  Clear throughout to auscultation.   Heart:  Regular rate and rhythm;  Abdomen:  Soft, nontender and nondistended.   Neuro/Psych:  Alert and cooperative. Normal mood and affect. A and O x 3  Jolly Mango, MD [facility]

## 2021-07-20 ENCOUNTER — Telehealth: Payer: Self-pay | Admitting: *Deleted

## 2021-07-20 NOTE — Telephone Encounter (Signed)
  Follow up Call-  Call back number 07/18/2021  Post procedure Call Back phone  # [phone] cell  Permission to leave phone message Yes  Some recent data might be hidden     Patient questions:  Do you have a fever, pain , or abdominal swelling? No. Pain Score  0 *  Have you tolerated food without any problems? Yes.    Have you been able to return to your normal activities? Yes.    Do you have any questions about your discharge instructions: Diet   No. Medications  No. Follow up visit  No.  Do you have questions or concerns about your Care? No.  Actions: * If pain score is 4 or above: No action needed, pain <4.

## 2021-07-28 ENCOUNTER — Telehealth: Payer: Self-pay | Admitting: Neurology

## 2021-07-28 NOTE — Telephone Encounter (Signed)
Pt came into the lobby to ask for a Cpap prescription for face mask because not working properly. He said [facility] told him he needed a prescription from the Dr. Abbott Pao had last appt with Dr. Rexene Alberts 2020 and cancelled 2 appt after made with [facility] and Amy. Please give the patient a call and advise. If he needs to schedule an appt in advance he is ok with that.  Thank you

## 2021-07-28 NOTE — Telephone Encounter (Signed)
Spoke with patient.  We are able to see data on [employer].  Patient aware he needs an appointment first before we can provide orders for supplies.  He was scheduled for next Tuesday 9/6 at 11:30 to see [facility] NP.  I asked the patient to bring his machine and power cord for this first appointment.  He verbalized understanding and appreciation.

## 2021-08-02 ENCOUNTER — Telehealth: Payer: Self-pay

## 2021-08-02 ENCOUNTER — Encounter: Payer: Self-pay | Admitting: Adult Health

## 2021-08-02 ENCOUNTER — Other Ambulatory Visit: Payer: Self-pay

## 2021-08-02 ENCOUNTER — Ambulatory Visit (INDEPENDENT_AMBULATORY_CARE_PROVIDER_SITE_OTHER): Payer: 59 | Admitting: Adult Health

## 2021-08-02 VITALS — BP 190/123 | HR 83 | Ht 65.0 in | Wt 207.6 lb

## 2021-08-02 DIAGNOSIS — Z9989 Dependence on other enabling machines and devices: Secondary | ICD-10-CM

## 2021-08-02 DIAGNOSIS — G4733 Obstructive sleep apnea (adult) (pediatric): Secondary | ICD-10-CM

## 2021-08-02 DIAGNOSIS — E782 Mixed hyperlipidemia: Secondary | ICD-10-CM

## 2021-08-02 MED ORDER — ATORVASTATIN CALCIUM 40 MG PO TABS: 40.0000 mg | ORAL_TABLET | Freq: Every day | ORAL | 0 refills | Status: DC

## 2021-08-02 NOTE — Telephone Encounter (Signed)
Pt is needing refill on atorvastatin (LIPITOR) 40 MG tablet   Pt has upcoming appt for Med Check on Friday and patient is out of medication   [place], [place] AT [place]   Pt call back [phone]

## 2021-08-02 NOTE — Progress Notes (Addendum)
PATIENT: Eric Obrien DOB: [birth year]  REASON FOR VISIT: follow up HISTORY FROM: patient PRIMARY NEUROLOGIST: Dr. Rexene Alberts  HISTORY OF PRESENT ILLNESS: Today 08/02/21:  Eric Obrien is a 55 year old male with a history of obstructive sleep apnea on CPAP.  His download indicates that he uses machine 20 out of 30 days for compliance of 67%.  He uses machine greater than 4 hours on 10 days for compliance of 33%.  On average he uses his machine 3 hours and 57 minutes.  His residual AHI is 8.2 on 20/16 centimeters of water his leak in the 95th percentile is 100 L/min.  Patient states that he still feels tired throughout the day.  Finds it hard to use the mask because of the leak.  HISTORY Eric Obrien is a 55 year old right-handed gentleman with an underlying medical history of hypertension, hyperlipidemia, chronic dry eyes, and obesity, who reports snoring and excessive daytime somnolence as well as morning headaches.  I reviewed your office note from 06/20/2019. His Epworth sleepiness score is 10 out of 24, fatigue severity score is 31 out of 63.  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  His bedtime is generally around 10, he often tries to go back to sleep around 5 AM and has to wake up at 630 for work.  He has a 54 year old and 20-year-old.  They have no pets in the house, he does have a TV in the bedroom and tries to turn it off before falling asleep.  He has nocturia, twice per average night typically.  He has had rare morning headaches.  His snoring has increased.  His wife has noted choking sounds while he is asleep.  He has gained about 10 pounds in the past 3 months.  He is a non-smoker and drinks alcohol rarely, caffeine in the form of coffee, 2 cups/day on average.  He is not aware of any family history of snoring or sleep apnea.  REVIEW OF  SYSTEMS: Out of a complete 14 system review of symptoms, the patient complains only of the following symptoms, and all other reviewed systems are negative.  ESS 17  ALLERGIES: No Known Allergies  HOME MEDICATIONS: Outpatient Medications Prior to Visit  Medication Sig Dispense Refill   hydrochlorothiazide (HYDRODIURIL) 25 MG tablet TAKE 1 TABLET(25 MG) BY MOUTH DAILY FOR BLOOD PRESSURE 30 tablet 0   RESTASIS 0.05 % ophthalmic emulsion      amLODipine (NORVASC) 10 MG tablet Take 1 tablet (10 mg total) by mouth daily. (Patient not taking: Reported on 08/02/2021) 90 tablet 3   atorvastatin (LIPITOR) 40 MG tablet Take 1 tablet (40 mg total) by mouth daily. (Patient not taking: Reported on 08/02/2021) 90 tablet 1   hydrOXYzine (ATARAX/VISTARIL) 10 MG tablet Take 1 tablet (10 mg total) by mouth 3 (three) times daily as needed. (Patient not taking: Reported on 08/02/2021) 30 tablet 0   Multiple Vitamin (MULTIVITAMIN WITH MINERALS) TABS tablet Take 1 tablet by mouth daily. (Patient not taking: Reported on 08/02/2021)     ondansetron (ZOFRAN) 4 MG tablet Take 1 tablet (4 mg total) by mouth as directed. 1 table 30-60 minutes prior to each prep dose. (Patient not taking: Reported on 08/02/2021) 2 tablet 0   prednisoLONE acetate (PRED FORTE) 1 % ophthalmic suspension  (Patient not taking: Reported on  08/02/2021)     triamcinolone (KENALOG) 0.1 % Apply 1 application topically 2 (two) times daily. (Patient not taking: Reported on 08/02/2021) 30 g 0   Facility-Administered Medications Prior to Visit  Medication Dose Route Frequency Provider Last Rate Last Admin   0.9 %  sodium chloride infusion  500 mL Intravenous Once Armbruster, Carlota Raspberry, MD        PAST MEDICAL HISTORY: Past Medical History:  Diagnosis Date   Hyperlipidemia    on meds   Hypertension    on meds   Sleep apnea    uses CPAP    PAST SURGICAL HISTORY: Past Surgical History:  Procedure Laterality Date   NO PAST SURGERIES      FAMILY  HISTORY: Family History  Problem Relation Age of Onset   Crohn's disease Other    Colon cancer Neg Hx    Esophageal cancer Neg Hx    Colon polyps Neg Hx    Stomach cancer Neg Hx    Rectal cancer Neg Hx     SOCIAL HISTORY: Social History   Socioeconomic History   Marital status: Married    Spouse name: Not on file   Number of children: 2   Years of education: Not on file   Highest education level: Not on file  Occupational History   Occupation: [employer]  Tobacco Use   Smoking status: Never   Smokeless tobacco: Never  Vaping Use   Vaping Use: Never used  Substance and Sexual Activity   Alcohol use: Not Currently    Alcohol/week: 2.0 standard drinks    Types: 2 Standard drinks or equivalent per week    Comment: rarely   Drug use: No   Sexual activity: Not Currently  Other Topics Concern   Not on file  Social History Narrative   Not on file   Social Determinants of Health   Financial Resource Strain: Not on file  Food Insecurity: Not on file  Transportation Needs: Not on file  Physical Activity: Not on file  Stress: Not on file  Social Connections: Not on file  Intimate Partner Violence: Not on file      PHYSICAL EXAM  Vitals:   08/02/21 1115 08/02/21 1122  BP: (!) 188/111 (!) 190/123  Pulse: 78 83  Weight: 207 lb 9.6 oz (94.2 kg)   Height: '5\' 5"'$  (1.651 m)    Body mass index is 34.55 kg/m.  Generalized: Well developed, in no acute distress  Chest: Lungs clear to auscultation bilaterally  Neurological examination  Mentation: Alert oriented to time, place, history taking. Follows all commands speech and language fluent Cranial nerve II-XII: Extraocular movements were full, visual field were full on confrontational test Head turning and shoulder shrug  were normal and symmetric. Motor: The motor testing reveals 5 over 5 strength of all 4 extremities. Good symmetric motor tone is noted throughout.  Sensory: Sensory testing is intact to soft touch on all 4  extremities. No evidence of extinction is noted.  Gait and station: Gait is normal.    DIAGNOSTIC DATA (LABS, IMAGING, TESTING) - I reviewed patient records, labs, notes, testing and imaging myself where available.  Lab Results  Component Value Date   WBC 7.5 01/31/2021   HGB 13.7 01/31/2021   HCT 41.7 01/31/2021   MCV 84 01/31/2021   PLT 284 09/27/2018      Component Value Date/Time   NA 141 01/31/2021 0000   K 3.5 01/31/2021 0000   CL 101 01/31/2021 0000   CO2  24 01/31/2021 0000   GLUCOSE 141 (H) 01/31/2021 0000   GLUCOSE 85 10/07/2015 1956   BUN 15 01/31/2021 0000   CREATININE 1.18 01/31/2021 0000   CREATININE 1.14 10/07/2015 1956   CALCIUM 9.5 01/31/2021 0000   PROT 7.2 01/31/2021 0000   ALBUMIN 4.4 01/31/2021 0000   AST 28 01/31/2021 0000   ALT 22 01/31/2021 0000   ALKPHOS 122 (H) 01/31/2021 0000   BILITOT 0.3 01/31/2021 0000   GFRNONAA 75 09/27/2018 1602   GFRNONAA 85 06/15/2015 1120   GFRAA 87 09/27/2018 1602   GFRAA >89 06/15/2015 1120    Lab Results  Component Value Date   TSH 1.660 09/27/2018      ASSESSMENT AND PLAN 55 y.o. year old male  has a past medical history of Hyperlipidemia, Hypertension, and Sleep apnea. here with:  OSA on CPAP  - PAP compliance suboptimal,  -Residual AHI elevated most likely due to mask leaking -Order sent for mask refit - Encourage patient to use PAP nightly and > 4 hours each night  2. Hypertension   - BP is elevated  - Advised to call PCP today to schedule an appointment - F/U in 3 months or sooner if needed    Eric Givens, MSN, NP-C 08/02/2021, 11:35 AM [facility] [address] [place], [place] [address] [phone]  I reviewed the above note and documentation by the Nurse Practitioner and agree with the history, exam, assessment and plan as outlined above. I was available for consultation. Star Age, MD, PhD [facility])

## 2021-08-02 NOTE — Telephone Encounter (Signed)
Medication sent to patient's pharmacy.

## 2021-08-03 NOTE — Progress Notes (Signed)
Mask refitting order message sent thru Epic to [facility].  Newman Pies

## 2021-08-04 NOTE — Progress Notes (Signed)
Owens, Ashley  Rocsi Hazelbaker S, RN got it!        

## 2021-08-05 ENCOUNTER — Other Ambulatory Visit: Payer: Self-pay

## 2021-08-05 ENCOUNTER — Ambulatory Visit (INDEPENDENT_AMBULATORY_CARE_PROVIDER_SITE_OTHER): Payer: 59 | Admitting: Registered Nurse

## 2021-08-05 ENCOUNTER — Encounter: Payer: Self-pay | Admitting: Registered Nurse

## 2021-08-05 VITALS — BP 170/100 | HR 74 | Temp 98.3°F | Resp 18 | Ht 65.0 in | Wt 207.0 lb

## 2021-08-05 DIAGNOSIS — E782 Mixed hyperlipidemia: Secondary | ICD-10-CM

## 2021-08-05 DIAGNOSIS — Z23 Encounter for immunization: Secondary | ICD-10-CM

## 2021-08-05 DIAGNOSIS — L299 Pruritus, unspecified: Secondary | ICD-10-CM | POA: Diagnosis not present

## 2021-08-05 DIAGNOSIS — I10 Essential (primary) hypertension: Secondary | ICD-10-CM | POA: Diagnosis not present

## 2021-08-05 LAB — CBC WITH DIFFERENTIAL/PLATELET
Basophils Absolute: 0.1 10*3/uL (ref 0.0–0.1)
Basophils Relative: 0.8 % (ref 0.0–3.0)
Eosinophils Absolute: 0.1 10*3/uL (ref 0.0–0.7)
Eosinophils Relative: 1.8 % (ref 0.0–5.0)
HCT: 40.3 % (ref 39.0–52.0)
Hemoglobin: 13.3 g/dL (ref 13.0–17.0)
Lymphocytes Relative: 41.6 % (ref 12.0–46.0)
Lymphs Abs: 3 10*3/uL (ref 0.7–4.0)
MCHC: 32.9 g/dL (ref 30.0–36.0)
MCV: 84.1 fl (ref 78.0–100.0)
Monocytes Absolute: 0.5 10*3/uL (ref 0.1–1.0)
Monocytes Relative: 6.9 % (ref 3.0–12.0)
Neutro Abs: 3.6 10*3/uL (ref 1.4–7.7)
Neutrophils Relative %: 48.9 % (ref 43.0–77.0)
Platelets: 219 10*3/uL (ref 150.0–400.0)
RBC: 4.79 Mil/uL (ref 4.22–5.81)
RDW: 13 % (ref 11.5–15.5)
WBC: 7.3 10*3/uL (ref 4.0–10.5)

## 2021-08-05 LAB — COMPREHENSIVE METABOLIC PANEL
ALT: 21 U/L (ref 0–53)
AST: 22 U/L (ref 0–37)
Albumin: 4.1 g/dL (ref 3.5–5.2)
Alkaline Phosphatase: 75 U/L (ref 39–117)
BUN: 18 mg/dL (ref 6–23)
CO2: 30 mEq/L (ref 19–32)
Calcium: 9.5 mg/dL (ref 8.4–10.5)
Chloride: 100 mEq/L (ref 96–112)
Creatinine, Ser: 1.25 mg/dL (ref 0.40–1.50)
GFR: 65.37 mL/min (ref >60.00)
Glucose, Bld: 110 mg/dL — ABNORMAL HIGH (ref 70–99)
Potassium: 3.6 mEq/L (ref 3.5–5.1)
Sodium: 138 mEq/L (ref 135–145)
Total Bilirubin: 0.5 mg/dL (ref 0.2–1.2)
Total Protein: 7 g/dL (ref 6.0–8.3)

## 2021-08-05 LAB — LIPID PANEL
Cholesterol: 281 mg/dL — ABNORMAL HIGH (ref 0–200)
HDL: 43.6 mg/dL (ref >39.00)
NonHDL: 237.63
Total CHOL/HDL Ratio: 6
Triglycerides: 217 mg/dL — ABNORMAL HIGH (ref 0.0–149.0)
VLDL: 43.4 mg/dL — ABNORMAL HIGH (ref 0.0–40.0)

## 2021-08-05 LAB — TSH: TSH: 1.18 u[IU]/mL (ref 0.35–5.50)

## 2021-08-05 LAB — HEMOGLOBIN A1C: Hgb A1c MFr Bld: 5.9 % (ref 4.6–6.5)

## 2021-08-05 LAB — LDL CHOLESTEROL, DIRECT: Direct LDL: 194 mg/dL

## 2021-08-05 MED ORDER — TRIAMCINOLONE ACETONIDE 0.1 % EX CREA: 1.0000 "application " | TOPICAL_CREAM | Freq: Two times a day (BID) | CUTANEOUS | 0 refills | Status: DC

## 2021-08-05 MED ORDER — AMLODIPINE BESY-BENAZEPRIL HCL 10-20 MG PO CAPS: 1.0000 | ORAL_CAPSULE | Freq: Every day | ORAL | 0 refills | Status: DC

## 2021-08-05 NOTE — Progress Notes (Signed)
Established Patient Office Visit  Subjective:  Patient ID: Eric Obrien, male    DOB: [birth year]  Age: 55 y.o. MRN: [id number]  CC:  Chief Complaint  Patient presents with   Follow-up    Med recheck    HPI Eric Obrien presents for med check  Hypertension: Patient Currently taking: no medication, had been on amlodipine 14m po qd and hctz 266mpo qd but has run out  No AE. Denies CV symptoms including: chest pain, shob, doe, headache, visual changes, fatigue, claudication, and dependent edema.   Previous readings and labs: BP Readings from Last 3 Encounters:  08/05/21 (!) 170/100  08/02/21 (!) 190/123  07/18/21 (!) 157/96   Lab Results  Component Value Date   CREATININE 1.18 01/31/2021      Hld Had been on statin, stopped taking No AE Lab Results  Component Value Date   CHOL 162 09/27/2018   HDL 40 09/27/2018   LDLCALC 103 (H) 09/27/2018   TRIG 97 09/27/2018   CHOLHDL 4.1 09/27/2018    Eczema Needs refill on kenalog Intermittent use without AE No other concerns   Concentration Going back to school while working full time Difficulty maintaining concentration No trouble sleeping, denies anxiety Would like specialist consult.  Past Medical History:  Diagnosis Date   Hyperlipidemia    on meds   Hypertension    on meds   Sleep apnea    uses CPAP    Past Surgical History:  Procedure Laterality Date   NO PAST SURGERIES      Family History  Problem Relation Age of Onset   Crohn's disease Other    Colon cancer Neg Hx    Esophageal cancer Neg Hx    Colon polyps Neg Hx    Stomach cancer Neg Hx    Rectal cancer Neg Hx     Social History   Socioeconomic History   Marital status: Married    Spouse name: Not on file   Number of children: 2   Years of education: Not on file   Highest education level: Not on file  Occupational History   Occupation: [employer]  Tobacco Use   Smoking status: Never   Smokeless tobacco: Never  Vaping Use   Vaping  Use: Never used  Substance and Sexual Activity   Alcohol use: Not Currently    Alcohol/week: 2.0 standard drinks    Types: 2 Standard drinks or equivalent per week    Comment: rarely   Drug use: No   Sexual activity: Not Currently  Other Topics Concern   Not on file  Social History Narrative   Not on file   Social Determinants of Health   Financial Resource Strain: Not on file  Food Insecurity: Not on file  Transportation Needs: Not on file  Physical Activity: Not on file  Stress: Not on file  Social Connections: Not on file  Intimate Partner Violence: Not on file    Outpatient Medications Prior to Visit  Medication Sig Dispense Refill   hydrochlorothiazide (HYDRODIURIL) 25 MG tablet TAKE 1 TABLET(25 MG) BY MOUTH DAILY FOR BLOOD PRESSURE 30 tablet 0   RESTASIS 0.05 % ophthalmic emulsion      triamcinolone (KENALOG) 0.1 % Apply 1 application topically 2 (two) times daily. 30 g 0   amLODipine (NORVASC) 10 MG tablet Take 1 tablet (10 mg total) by mouth daily. (Patient not taking: No sig reported) 90 tablet 3   atorvastatin (LIPITOR) 40 MG tablet Take 1 tablet (40 mg total)  by mouth daily. (Patient not taking: Reported on 08/05/2021) 30 tablet 0   hydrOXYzine (ATARAX/VISTARIL) 10 MG tablet Take 1 tablet (10 mg total) by mouth 3 (three) times daily as needed. (Patient not taking: No sig reported) 30 tablet 0   Multiple Vitamin (MULTIVITAMIN WITH MINERALS) TABS tablet Take 1 tablet by mouth daily. (Patient not taking: No sig reported)     ondansetron (ZOFRAN) 4 MG tablet Take 1 tablet (4 mg total) by mouth as directed. 1 table 30-60 minutes prior to each prep dose. (Patient not taking: No sig reported) 2 tablet 0   prednisoLONE acetate (PRED FORTE) 1 % ophthalmic suspension  (Patient not taking: No sig reported)     Facility-Administered Medications Prior to Visit  Medication Dose Route Frequency Provider Last Rate Last Admin   0.9 %  sodium chloride infusion  500 mL Intravenous Once  Armbruster, Carlota Raspberry, MD        No Known Allergies  ROS Review of Systems  Constitutional: Negative.   HENT: Negative.    Eyes: Negative.   Respiratory: Negative.    Cardiovascular: Negative.   Gastrointestinal: Negative.   Genitourinary: Negative.   Musculoskeletal: Negative.   Skin: Negative.   Neurological: Negative.   Psychiatric/Behavioral: Negative.    All other systems reviewed and are negative.    Objective:    Physical Exam Constitutional:      General: He is not in acute distress.    Appearance: Normal appearance. He is normal weight. He is not ill-appearing, toxic-appearing or diaphoretic.  Cardiovascular:     Rate and Rhythm: Normal rate and regular rhythm.     Heart sounds: Normal heart sounds. No murmur heard.   No friction rub. No gallop.  Pulmonary:     Effort: Pulmonary effort is normal. No respiratory distress.     Breath sounds: Normal breath sounds. No stridor. No wheezing, rhonchi or rales.  Chest:     Chest wall: No tenderness.  Neurological:     General: No focal deficit present.     Mental Status: He is alert and oriented to person, place, and time. Mental status is at baseline.  Psychiatric:        Mood and Affect: Mood normal.        Behavior: Behavior normal.        Thought Content: Thought content normal.        Judgment: Judgment normal.    BP (!) 170/100   Pulse 74   Temp 98.3 F (36.8 C) (Temporal)   Resp 18   Ht 5' 5"  (1.651 m)   Wt 207 lb (93.9 kg)   SpO2 98%   BMI 34.45 kg/m  Wt Readings from Last 3 Encounters:  08/05/21 207 lb (93.9 kg)  08/02/21 207 lb 9.6 oz (94.2 kg)  07/18/21 209 lb (94.8 kg)     Health Maintenance Due  Topic Date Due   Zoster Vaccines- Shingrix (1 of 2) Never done   COVID-19 Vaccine (2 - Pfizer series) 11/05/2020   INFLUENZA VACCINE  06/27/2021    There are no preventive care reminders to display for this patient.  Lab Results  Component Value Date   TSH 1.660 09/27/2018   Lab Results   Component Value Date   WBC 7.5 01/31/2021   HGB 13.7 01/31/2021   HCT 41.7 01/31/2021   MCV 84 01/31/2021   PLT 284 09/27/2018   Lab Results  Component Value Date   NA 141 01/31/2021   K 3.5 01/31/2021  CO2 24 01/31/2021   GLUCOSE 141 (H) 01/31/2021   BUN 15 01/31/2021   CREATININE 1.18 01/31/2021   BILITOT 0.3 01/31/2021   ALKPHOS 122 (H) 01/31/2021   AST 28 01/31/2021   ALT 22 01/31/2021   PROT 7.2 01/31/2021   ALBUMIN 4.4 01/31/2021   CALCIUM 9.5 01/31/2021   EGFR 74 01/31/2021   Lab Results  Component Value Date   CHOL 162 09/27/2018   Lab Results  Component Value Date   HDL 40 09/27/2018   Lab Results  Component Value Date   LDLCALC 103 (H) 09/27/2018   Lab Results  Component Value Date   TRIG 97 09/27/2018   Lab Results  Component Value Date   CHOLHDL 4.1 09/27/2018   No results found for: HGBA1C    Assessment & Plan:   Problem List Items Addressed This Visit       Other   Hyperlipidemia - Primary   Relevant Medications   amLODipine-benazepril (LOTREL) 10-20 MG capsule   Other Relevant Orders   Hemoglobin A1c   Lipid panel   Other Visit Diagnoses     Essential hypertension       Relevant Medications   amLODipine-benazepril (LOTREL) 10-20 MG capsule   Other Relevant Orders   Comprehensive metabolic panel   CBC with Differential/Platelet   TSH   Itch       Relevant Medications   triamcinolone cream (KENALOG) 0.1 %       Meds ordered this encounter  Medications   triamcinolone cream (KENALOG) 0.1 %    Sig: Apply 1 application topically 2 (two) times daily.    Dispense:  30 g    Refill:  0   amLODipine-benazepril (LOTREL) 10-20 MG capsule    Sig: Take 1 capsule by mouth daily.    Dispense:  30 capsule    Refill:  0    Order Specific Question:   Supervising Provider    Answer:   Carlota Raspberry, JEFFREY R [[id number]]    Follow-up: Return in about 4 weeks (around 09/02/2021) for HTN with provider.   PLAN Will change therapy - opt for  long acting ccb w/ ace-I per [facility]PL[facility] trial. Start amlodipine-benazepril 10-49m po qd. Follow up in 4 weeks for bp check. If still elevated, will increase medication to 10-467mpo qd. Labs collected. Will follow up with the patient as warranted. Given info for [facility] Patient encouraged to call clinic with any questions, comments, or concerns.  RiMaximiano CossNP

## 2021-08-05 NOTE — Patient Instructions (Addendum)
Mr. Eric Obrien to see you  Let's change up BP medication - start Amlodipine-Benazepril 10-'20mg'$  daily. Should not give any side effects.  I want to recheck in 4 weeks. This visit should be quick - if the bp is good, we can go to 3 mo follow up. If it's still high, we increase to 10-'40mg'$  dosing.   Labs today will be back tomorrow. I'll call with any urgent concerns.    Thank you   Rich     My recommendation for assessment for attention deficit disorders is [facility]. While they treat ADD and ADHD, they operate under their primary care licenses, so no referral is required. Their information is as below:  [facility] [phone], [place], [place] [address]  Phone: [phone] Email: [email or web address]  If they are able to make a diagnosis and establish an effective medication routine, I am happy to provide maintenance and refills of that medication.   If you'd prefer a referral with a psychiatry group, please let me know, I'd be happy to place this referral.

## 2021-08-05 NOTE — Addendum Note (Signed)
Addended by: Genevie Cheshire L on: 08/05/2021 12:21 PM   Modules accepted: Orders

## 2021-08-09 ENCOUNTER — Encounter: Payer: Self-pay | Admitting: Registered Nurse

## 2021-08-09 ENCOUNTER — Telehealth: Payer: Self-pay | Admitting: Adult Health

## 2021-08-09 NOTE — Telephone Encounter (Signed)
Per Marin Comment, she called pt last week and LVM with billing number as patient has a balance with collections and has been making payments, but the card on file is no longer active so that is why his account is on hold. Caryl Pina called the pt again today and LVM. I advised the patient of this and he said he would give [facility] a call to address this.

## 2021-08-09 NOTE — Telephone Encounter (Signed)
Spoke with the patient.  He states [facility] told him that he did not receive her.  I let him know we had sent orders over to them and they confirmed receipt last week.  I let them know I would personally follow-up.  I sent a secure message to [employer] regarding the order.

## 2021-08-09 NOTE — Telephone Encounter (Signed)
Pt called, checking if have contacted [facility] about a new CPAP machine. Would like a call from the nurse.

## 2021-09-09 ENCOUNTER — Ambulatory Visit (INDEPENDENT_AMBULATORY_CARE_PROVIDER_SITE_OTHER): Payer: 59 | Admitting: Registered Nurse

## 2021-09-09 ENCOUNTER — Other Ambulatory Visit: Payer: Self-pay

## 2021-09-09 ENCOUNTER — Encounter: Payer: Self-pay | Admitting: Registered Nurse

## 2021-09-09 VITALS — BP 148/75 | HR 91 | Temp 98.4°F | Resp 18 | Ht 65.0 in | Wt 202.8 lb

## 2021-09-09 DIAGNOSIS — I10 Essential (primary) hypertension: Secondary | ICD-10-CM | POA: Diagnosis not present

## 2021-09-09 DIAGNOSIS — L299 Pruritus, unspecified: Secondary | ICD-10-CM | POA: Insufficient documentation

## 2021-09-09 DIAGNOSIS — E782 Mixed hyperlipidemia: Secondary | ICD-10-CM

## 2021-09-09 MED ORDER — OLMESARTAN-AMLODIPINE-HCTZ 40-10-12.5 MG PO TABS: 1.0000 | ORAL_TABLET | Freq: Every day | ORAL | 0 refills | Status: DC

## 2021-09-09 MED ORDER — ROSUVASTATIN CALCIUM 20 MG PO TABS: 20.0000 mg | ORAL_TABLET | Freq: Every day | ORAL | 3 refills | Status: DC

## 2021-09-09 MED ORDER — BLOOD PRESSURE CUFF MISC: 1.0000 | Freq: Every day | 0 refills | Status: AC

## 2021-09-09 MED ORDER — TRIAMCINOLONE ACETONIDE 0.1 % EX CREA: 1.0000 "application " | TOPICAL_CREAM | Freq: Two times a day (BID) | CUTANEOUS | 6 refills | Status: DC

## 2021-09-09 NOTE — Progress Notes (Signed)
Established Patient Office Visit  Subjective:  Patient ID: Eric Obrien, male    DOB: 25-Jun-1966  Age: 55 y.o. MRN: [id number]  CC:  Chief Complaint  Patient presents with   Follow-up    Patient states he is here for a follow up and would like to discuss his reactions to new medication.    HPI Eric Obrien presents for htn  Hypertension: Patient Currently taking: amlodipine-benazepril 10-1m po qd Limited effect - bp still high. AE of frequent bowel movements. Denies CV symptoms including: chest pain, shob, doe, headache, visual changes, fatigue, claudication, and dependent edema.   Previous readings and labs: BP Readings from Last 3 Encounters:  09/09/21 (!) 148/75  08/05/21 (!) 170/100  08/02/21 (!) 190/123   Lab Results  Component Value Date   CREATININE 1.25 08/05/2021   HLD Has not taken atoravstatin Notes frequent bowel movements No abdominal pain, urinary changes, or cough.  Attention deficit Ongoing Wants to be worked up for ADD/ADHD. No other concerns.    ITCH Does nee refill on triamcinolone Uses intermittently for itch No AE.   Past Medical History:  Diagnosis Date   Hyperlipidemia    on meds   Hypertension    on meds   Sleep apnea    uses CPAP    Past Surgical History:  Procedure Laterality Date   NO PAST SURGERIES      Family History  Problem Relation Age of Onset   Crohn's disease Other    Colon cancer Neg Hx    Esophageal cancer Neg Hx    Colon polyps Neg Hx    Stomach cancer Neg Hx    Rectal cancer Neg Hx     Social History   Socioeconomic History   Marital status: Married    Spouse name: Not on file   Number of children: 2   Years of education: Not on file   Highest education level: Not on file  Occupational History   Occupation: [employer]  Tobacco Use   Smoking status: Never   Smokeless tobacco: Never  Vaping Use   Vaping Use: Never used  Substance and Sexual Activity   Alcohol use: Not Currently    Alcohol/week:  2.0 standard drinks    Types: 2 Standard drinks or equivalent per week    Comment: rarely   Drug use: No   Sexual activity: Not Currently  Other Topics Concern   Not on file  Social History Narrative   Not on file   Social Determinants of Health   Financial Resource Strain: Not on file  Food Insecurity: Not on file  Transportation Needs: Not on file  Physical Activity: Not on file  Stress: Not on file  Social Connections: Not on file  Intimate Partner Violence: Not on file    Outpatient Medications Prior to Visit  Medication Sig Dispense Refill   RESTASIS 0.05 % ophthalmic emulsion      amLODipine-benazepril (LOTREL) 10-20 MG capsule Take 1 capsule by mouth daily. 30 capsule 0   hydrochlorothiazide (HYDRODIURIL) 25 MG tablet TAKE 1 TABLET(25 MG) BY MOUTH DAILY FOR BLOOD PRESSURE 30 tablet 0   triamcinolone cream (KENALOG) 0.1 % Apply 1 application topically 2 (two) times daily. 30 g 0   atorvastatin (LIPITOR) 40 MG tablet Take 1 tablet (40 mg total) by mouth daily. (Patient not taking: No sig reported) 30 tablet 0   Facility-Administered Medications Prior to Visit  Medication Dose Route Frequency Provider Last Rate Last Admin   0.9 %  sodium chloride infusion  500 mL Intravenous Once Armbruster, Carlota Raspberry, MD        No Known Allergies  ROS Review of Systems  Constitutional: Negative.   HENT: Negative.    Eyes: Negative.   Respiratory: Negative.    Cardiovascular: Negative.   Gastrointestinal: Negative.   Genitourinary: Negative.   Musculoskeletal: Negative.   Skin: Negative.   Neurological: Negative.   Psychiatric/Behavioral: Negative.    All other systems reviewed and are negative.    Objective:    Physical Exam Constitutional:      General: He is not in acute distress.    Appearance: Normal appearance. He is normal weight. He is not ill-appearing, toxic-appearing or diaphoretic.  Cardiovascular:     Rate and Rhythm: Normal rate and regular rhythm.      Heart sounds: Normal heart sounds. No murmur heard.   No friction rub. No gallop.  Pulmonary:     Effort: Pulmonary effort is normal. No respiratory distress.     Breath sounds: Normal breath sounds. No stridor. No wheezing, rhonchi or rales.  Chest:     Chest wall: No tenderness.  Neurological:     General: No focal deficit present.     Mental Status: He is alert and oriented to person, place, and time. Mental status is at baseline.  Psychiatric:        Mood and Affect: Mood normal.        Behavior: Behavior normal.        Thought Content: Thought content normal.        Judgment: Judgment normal.    BP (!) 148/75   Pulse 91   Temp 98.4 F (36.9 C) (Temporal)   Resp 18   Ht _0  (1.651 m)   Wt 202 lb 12.8 oz (92 kg)   SpO2 99%   BMI 33.75 kg/m  Wt Readings from Last 3 Encounters:  09/09/21 202 lb 12.8 oz (92 kg)  08/05/21 207 lb (93.9 kg)  08/02/21 207 lb 9.6 oz (94.2 kg)     Health Maintenance Due  Topic Date Due   Zoster Vaccines- Shingrix (1 of 2) Never done    There are no preventive care reminders to display for this patient.  Lab Results  Component Value Date   TSH 1.18 08/05/2021   Lab Results  Component Value Date   WBC 7.3 08/05/2021   HGB 13.3 08/05/2021   HCT 40.3 08/05/2021   MCV 84.1 08/05/2021   PLT 219.0 08/05/2021   Lab Results  Component Value Date   NA 138 08/05/2021   K 3.6 08/05/2021   CO2 30 08/05/2021   GLUCOSE 110 (H) 08/05/2021   BUN 18 08/05/2021   CREATININE 1.25 08/05/2021   BILITOT 0.5 08/05/2021   ALKPHOS 75 08/05/2021   AST 22 08/05/2021   ALT 21 08/05/2021   PROT 7.0 08/05/2021   ALBUMIN 4.1 08/05/2021   CALCIUM 9.5 08/05/2021   EGFR 74 01/31/2021   GFR 65.37 08/05/2021   Lab Results  Component Value Date   CHOL 281 (H) 08/05/2021   Lab Results  Component Value Date   HDL 43.60 08/05/2021   Lab Results  Component Value Date   LDLCALC 103 (H) 09/27/2018   Lab Results  Component Value Date   TRIG 217.0  (H) 08/05/2021   Lab Results  Component Value Date   CHOLHDL 6 08/05/2021   Lab Results  Component Value Date   HGBA1C 5.9 08/05/2021  Assessment & Plan:   Problem List Items Addressed This Visit       Cardiovascular and Mediastinum   Uncontrolled hypertension - Primary   Relevant Medications   Olmesartan-amLODIPine-HCTZ 40-10-12.5 MG TABS   Blood Pressure Monitoring (BLOOD PRESSURE CUFF) MISC   rosuvastatin (CRESTOR) 20 MG tablet     Other   Hyperlipidemia   Relevant Medications   Olmesartan-amLODIPine-HCTZ 40-10-12.5 MG TABS   rosuvastatin (CRESTOR) 20 MG tablet   Itch   Relevant Medications   triamcinolone cream (KENALOG) 0.1 %    Meds ordered this encounter  Medications   Olmesartan-amLODIPine-HCTZ 40-10-12.5 MG TABS    Sig: Take 1 tablet by mouth daily.    Dispense:  90 tablet    Refill:  0    Order Specific Question:   Supervising Provider    Answer:   Carlota Raspberry, JEFFREY R [[id number]]   Blood Pressure Monitoring (BLOOD PRESSURE CUFF) MISC    Sig: 1 each by Does not apply route daily.    Dispense:  1 each    Refill:  0    Order Specific Question:   Supervising Provider    Answer:   Carlota Raspberry, JEFFREY R [[id number]]   triamcinolone cream (KENALOG) 0.1 %    Sig: Apply 1 application topically 2 (two) times daily.    Dispense:  30 g    Refill:  6    Order Specific Question:   Supervising Provider    Answer:   Carlota Raspberry, JEFFREY R [[id number]]   rosuvastatin (CRESTOR) 20 MG tablet    Sig: Take 1 tablet (20 mg total) by mouth daily.    Dispense:  90 tablet    Refill:  3    Order Specific Question:   Supervising Provider    Answer:   Carlota Raspberry, JEFFREY R [[id number]]    Follow-up: Return in about 6 weeks (around 10/21/2021) for HTN, HLD.   PLAN Change antihypertensive to olmesartan-amlodipne-hctz 40-10-12.75m po qd.  Change to rosuvastatin for cholesterol lowering medication - in case atorvastatin was contributing to loose stools. Return in 6 weeks Given information on [facility] for ADD/ADHD work up.  Patient encouraged to call clinic with any questions, comments, or concerns.  RMaximiano Coss NP

## 2021-09-09 NOTE — Patient Instructions (Addendum)
My recommendation for assessment for attention deficit disorders is [facility]. While they treat ADD and ADHD, they operate under their primary care licenses, so no referral is required. Their information is as below:  [facility] [phone], [place], [place] [address]  Phone: [phone] Email: [email or web address]  If they are able to make a diagnosis and establish an effective medication routine, I am happy to provide maintenance and refills of that medication.   If you'd prefer a referral with a psychiatry group, please let me know, I'd be happy to place this referral.   We are going to change up both blood pressure and cholesterol medication as noted below. Call with any side effects. Otherwise, can check in at around the 6 week mark. Call sooner if you need anything   Thank you  Rich   If you have lab work done today you will be contacted with your lab results within the next 2 weeks.  If you have not heard from [place] then please contact us. The fastest way to get your results is to register for My Chart.   IF you received an x-ray today, you will receive an invoice from [facility]. Please contact [facility] at [phone] with questions or concerns regarding your invoice.   IF you received labwork today, you will receive an invoice from [place]. Please contact [facility] at [phone] with questions or concerns regarding your invoice.   Our billing staff will not be able to assist you with questions regarding bills from these companies.  You will be contacted with the lab results as soon as they are available. The fastest way to get your results is to activate your My Chart account. Instructions are located on the last page of this paperwork. If you have not heard from [place] regarding the results in 2 weeks, please contact this office.

## 2021-10-25 ENCOUNTER — Other Ambulatory Visit: Payer: Self-pay

## 2021-10-25 ENCOUNTER — Ambulatory Visit (INDEPENDENT_AMBULATORY_CARE_PROVIDER_SITE_OTHER): Payer: 59 | Admitting: Registered Nurse

## 2021-10-25 ENCOUNTER — Encounter: Payer: Self-pay | Admitting: Registered Nurse

## 2021-10-25 VITALS — BP 142/77 | HR 71 | Temp 98.2°F | Resp 18 | Ht 65.0 in | Wt 207.0 lb

## 2021-10-25 DIAGNOSIS — E782 Mixed hyperlipidemia: Secondary | ICD-10-CM

## 2021-10-25 DIAGNOSIS — L299 Pruritus, unspecified: Secondary | ICD-10-CM | POA: Diagnosis not present

## 2021-10-25 DIAGNOSIS — I10 Essential (primary) hypertension: Secondary | ICD-10-CM | POA: Diagnosis not present

## 2021-10-25 LAB — LIPID PANEL
Cholesterol: 192 mg/dL (ref 0–200)
HDL: 42.6 mg/dL (ref >39.00)
NonHDL: 149.43
Total CHOL/HDL Ratio: 5
Triglycerides: 246 mg/dL — ABNORMAL HIGH (ref 0.0–149.0)
VLDL: 49.2 mg/dL — ABNORMAL HIGH (ref 0.0–40.0)

## 2021-10-25 LAB — COMPREHENSIVE METABOLIC PANEL
ALT: 24 U/L (ref 0–53)
AST: 24 U/L (ref 0–37)
Albumin: 4.4 g/dL (ref 3.5–5.2)
Alkaline Phosphatase: 68 U/L (ref 39–117)
BUN: 15 mg/dL (ref 6–23)
CO2: 33 mEq/L — ABNORMAL HIGH (ref 19–32)
Calcium: 9.7 mg/dL (ref 8.4–10.5)
Chloride: 100 mEq/L (ref 96–112)
Creatinine, Ser: 1.23 mg/dL (ref 0.40–1.50)
GFR: 66.54 mL/min (ref >60.00)
Glucose, Bld: 131 mg/dL — ABNORMAL HIGH (ref 70–99)
Potassium: 3.6 mEq/L (ref 3.5–5.1)
Sodium: 139 mEq/L (ref 135–145)
Total Bilirubin: 0.5 mg/dL (ref 0.2–1.2)
Total Protein: 7.2 g/dL (ref 6.0–8.3)

## 2021-10-25 LAB — LDL CHOLESTEROL, DIRECT: Direct LDL: 115 mg/dL

## 2021-10-25 MED ORDER — FLUOCINOLONE ACETONIDE 0.01 % EX CREA: TOPICAL_CREAM | Freq: Two times a day (BID) | CUTANEOUS | 1 refills | Status: DC

## 2021-10-25 MED ORDER — ROSUVASTATIN CALCIUM 20 MG PO TABS: 20.0000 mg | ORAL_TABLET | Freq: Every day | ORAL | 0 refills | Status: DC

## 2021-10-25 MED ORDER — OLMESARTAN-AMLODIPINE-HCTZ 40-10-25 MG PO TABS: 1.0000 | ORAL_TABLET | Freq: Every day | ORAL | 0 refills | Status: DC

## 2021-10-25 NOTE — Patient Instructions (Addendum)
Mr. Eric Obrien to see you!  Blood pressure is just shy of goal - I want to increase the medication to 40-10-25mg  daily.   Keep rosuvastatin the same for now. We'll check on labs today.  I'll call if there are any concerns on results.  Can use the new cream on your back. If not effective, I have referred to dermatology. They will call you soon.   See you in 3 months! Sooner if you need anything.  Have a happy holiday season!  Rich

## 2021-10-25 NOTE — Progress Notes (Signed)
Established Patient Office Visit  Subjective:  Patient ID: Eric Obrien, male    DOB: [birth year]  Age: 55 y.o. MRN: [id number]  CC:  Chief Complaint  Patient presents with   Follow-up    Patient states he is here for his follow up for his BP and cholesterol.    HPI Eric Obrien presents for htn and hld  Hypertension: Patient Currently taking: olmesartan-amlodipine-hctz 40-10-12.5 po qd Good effect. No AEs. Denies CV symptoms including: chest pain, shob, doe, headache, visual changes, fatigue, claudication, and dependent edema.   Previous readings and labs: BP Readings from Last 3 Encounters:  10/25/21 (!) 142/77  09/09/21 (!) 148/75  08/05/21 (!) 170/100   Lab Results  Component Value Date   CREATININE 1.25 08/05/2021     HLD Taking rosuvastatin 17m po qd Good effect, no AE, due to recheck today.  Lab Results  Component Value Date   CHOL 281 (H) 08/05/2021   HDL 43.60 08/05/2021   LDLCALC 103 (H) 09/27/2018   LDLDIRECT 194.0 08/05/2021   TRIG 217.0 (H) 08/05/2021   CHOLHDL 6 08/05/2021    Itch Ongoing, upper back, no rash  Has been using triamcinolone with limited relief Interested in alternative, stronger agent.  Has not seen derm.   Past Medical History:  Diagnosis Date   Hyperlipidemia    on meds   Hypertension    on meds   Sleep apnea    uses CPAP    Past Surgical History:  Procedure Laterality Date   NO PAST SURGERIES      Family History  Problem Relation Age of Onset   Crohn's disease Other    Colon cancer Neg Hx    Esophageal cancer Neg Hx    Colon polyps Neg Hx    Stomach cancer Neg Hx    Rectal cancer Neg Hx     Social History   Socioeconomic History   Marital status: Married    Spouse name: Not on file   Number of children: 2   Years of education: Not on file   Highest education level: Not on file  Occupational History   Occupation: [employer]  Tobacco Use   Smoking status: Never   Smokeless tobacco: Never  Vaping Use    Vaping Use: Never used  Substance and Sexual Activity   Alcohol use: Not Currently    Alcohol/week: 2.0 standard drinks    Types: 2 Standard drinks or equivalent per week    Comment: rarely   Drug use: No   Sexual activity: Not Currently  Other Topics Concern   Not on file  Social History Narrative   Not on file   Social Determinants of Health   Financial Resource Strain: Not on file  Food Insecurity: Not on file  Transportation Needs: Not on file  Physical Activity: Not on file  Stress: Not on file  Social Connections: Not on file  Intimate Partner Violence: Not on file    Outpatient Medications Prior to Visit  Medication Sig Dispense Refill   Blood Pressure Monitoring (BLOOD PRESSURE CUFF) MISC 1 each by Does not apply route daily. 1 each 0   RESTASIS 0.05 % ophthalmic emulsion      Olmesartan-amLODIPine-HCTZ 40-10-12.5 MG TABS Take 1 tablet by mouth daily. 90 tablet 0   rosuvastatin (CRESTOR) 20 MG tablet Take 1 tablet (20 mg total) by mouth daily. 90 tablet 3   triamcinolone cream (KENALOG) 0.1 % Apply 1 application topically 2 (two) times daily. 30 g 6  Facility-Administered Medications Prior to Visit  Medication Dose Route Frequency Provider Last Rate Last Admin   0.9 %  sodium chloride infusion  500 mL Intravenous Once Armbruster, Carlota Raspberry, MD        No Known Allergies  ROS Review of Systems  Constitutional: Negative.   HENT: Negative.    Eyes: Negative.   Respiratory: Negative.    Cardiovascular: Negative.   Gastrointestinal: Negative.   Genitourinary: Negative.   Musculoskeletal: Negative.   Skin: Negative.   Neurological: Negative.   Psychiatric/Behavioral: Negative.    All other systems reviewed and are negative.    Objective:    Physical Exam Constitutional:      General: He is not in acute distress.    Appearance: Normal appearance. He is normal weight. He is not ill-appearing, toxic-appearing or diaphoretic.  Cardiovascular:     Rate and  Rhythm: Normal rate and regular rhythm.     Heart sounds: Normal heart sounds. No murmur heard.   No friction rub. No gallop.  Pulmonary:     Effort: Pulmonary effort is normal. No respiratory distress.     Breath sounds: Normal breath sounds. No stridor. No wheezing, rhonchi or rales.  Chest:     Chest wall: No tenderness.  Neurological:     General: No focal deficit present.     Mental Status: He is alert and oriented to person, place, and time. Mental status is at baseline.  Psychiatric:        Mood and Affect: Mood normal.        Behavior: Behavior normal.        Thought Content: Thought content normal.        Judgment: Judgment normal.    BP (!) 142/77   Pulse 71   Temp 98.2 F (36.8 C) (Temporal)   Resp 18   Ht $R'5\' 5"'ai$  (1.651 m)   Wt 207 lb (93.9 kg)   SpO2 99%   BMI 34.45 kg/m  Wt Readings from Last 3 Encounters:  10/25/21 207 lb (93.9 kg)  09/09/21 202 lb 12.8 oz (92 kg)  08/05/21 207 lb (93.9 kg)     Health Maintenance Due  Topic Date Due   Zoster Vaccines- Shingrix (1 of 2) Never done   COVID-19 Vaccine (2 - Pfizer series) 11/05/2020    There are no preventive care reminders to display for this patient.  Lab Results  Component Value Date   TSH 1.18 08/05/2021   Lab Results  Component Value Date   WBC 7.3 08/05/2021   HGB 13.3 08/05/2021   HCT 40.3 08/05/2021   MCV 84.1 08/05/2021   PLT 219.0 08/05/2021   Lab Results  Component Value Date   NA 138 08/05/2021   K 3.6 08/05/2021   CO2 30 08/05/2021   GLUCOSE 110 (H) 08/05/2021   BUN 18 08/05/2021   CREATININE 1.25 08/05/2021   BILITOT 0.5 08/05/2021   ALKPHOS 75 08/05/2021   AST 22 08/05/2021   ALT 21 08/05/2021   PROT 7.0 08/05/2021   ALBUMIN 4.1 08/05/2021   CALCIUM 9.5 08/05/2021   EGFR 74 01/31/2021   GFR 65.37 08/05/2021   Lab Results  Component Value Date   CHOL 281 (H) 08/05/2021   Lab Results  Component Value Date   HDL 43.60 08/05/2021   Lab Results  Component Value Date    LDLCALC 103 (H) 09/27/2018   Lab Results  Component Value Date   TRIG 217.0 (H) 08/05/2021   Lab Results  Component  Value Date   CHOLHDL 6 08/05/2021   Lab Results  Component Value Date   HGBA1C 5.9 08/05/2021      Assessment & Plan:   Problem List Items Addressed This Visit       Other   Hyperlipidemia   Relevant Medications   Olmesartan-amLODIPine-HCTZ 40-10-25 MG TABS   rosuvastatin (CRESTOR) 20 MG tablet   Other Relevant Orders   Comprehensive metabolic panel   Lipid panel   Itch   Relevant Medications   fluocinolone (VANOS) 0.01 % cream   Other Relevant Orders   Ambulatory referral to Dermatology   Other Visit Diagnoses     Essential hypertension    -  Primary   Relevant Medications   Olmesartan-amLODIPine-HCTZ 40-10-25 MG TABS   rosuvastatin (CRESTOR) 20 MG tablet   Other Relevant Orders   Comprehensive metabolic panel   Lipid panel       Meds ordered this encounter  Medications   Olmesartan-amLODIPine-HCTZ 40-10-25 MG TABS    Sig: Take 1 tablet by mouth daily.    Dispense:  90 tablet    Refill:  0    Order Specific Question:   Supervising Provider    Answer:   Carlota Raspberry, JEFFREY R [[id number]]   rosuvastatin (CRESTOR) 20 MG tablet    Sig: Take 1 tablet (20 mg total) by mouth daily.    Dispense:  90 tablet    Refill:  0    Order Specific Question:   Supervising Provider    Answer:   Carlota Raspberry, JEFFREY R [[id number]]   fluocinolone (VANOS) 0.01 % cream    Sig: Apply topically 2 (two) times daily.    Dispense:  30 g    Refill:  1    Order Specific Question:   Supervising Provider    Answer:   Carlota Raspberry, JEFFREY R [[id number]]    Follow-up: Return in about 3 months (around 01/24/2022) for htn, hld.   PLAN Bp still above goal. Increase to olmesartan-amlodipine-hctz 40-10-25mg po qd. Recheck in 3 mo Recheck lipids today. Adjust therapy if out of range. Refer to derm for ongoing itching.  Patient encouraged to call clinic with any questions, comments, or  concerns.   Maximiano Coss, NP

## 2021-10-28 ENCOUNTER — Telehealth: Payer: Self-pay | Admitting: Registered Nurse

## 2021-10-28 NOTE — Telephone Encounter (Signed)
Pt wanted to know if Rich wanted him to get the shingles vac and if so he wanted to schedule an appt next week.

## 2021-11-02 NOTE — Telephone Encounter (Signed)
Yes - would prefer he does.   Can schedule 2 shot series as nurse visits or he can do one of them at his next visit with me in February if he'd like  Thanks  [name]

## 2021-11-03 NOTE — Telephone Encounter (Signed)
Attempted call line did not go through

## 2021-11-04 NOTE — Telephone Encounter (Signed)
Called again to attempt to schedule shingels vaccine

## 2021-11-16 ENCOUNTER — Other Ambulatory Visit: Payer: Self-pay | Admitting: Registered Nurse

## 2021-11-16 DIAGNOSIS — L299 Pruritus, unspecified: Secondary | ICD-10-CM

## 2021-11-16 DIAGNOSIS — E782 Mixed hyperlipidemia: Secondary | ICD-10-CM

## 2021-11-16 MED ORDER — FLUOCINONIDE 0.05 % EX OINT: 1.0000 "application " | TOPICAL_OINTMENT | Freq: Two times a day (BID) | CUTANEOUS | 0 refills | Status: AC

## 2021-11-25 ENCOUNTER — Other Ambulatory Visit: Payer: Self-pay | Admitting: Registered Nurse

## 2021-11-25 DIAGNOSIS — L299 Pruritus, unspecified: Secondary | ICD-10-CM

## 2022-01-06 ENCOUNTER — Other Ambulatory Visit: Payer: Self-pay | Admitting: Registered Nurse

## 2022-01-06 DIAGNOSIS — L299 Pruritus, unspecified: Secondary | ICD-10-CM

## 2022-01-24 ENCOUNTER — Ambulatory Visit: Payer: 59 | Admitting: Registered Nurse

## 2022-02-21 ENCOUNTER — Ambulatory Visit: Payer: Self-pay | Admitting: Registered Nurse

## 2022-02-22 ENCOUNTER — Other Ambulatory Visit: Payer: Self-pay | Admitting: Registered Nurse

## 2022-02-22 DIAGNOSIS — L299 Pruritus, unspecified: Secondary | ICD-10-CM

## 2022-02-23 ENCOUNTER — Telehealth: Payer: Self-pay | Admitting: *Deleted

## 2022-02-23 NOTE — Telephone Encounter (Signed)
We received an order form via fax from [employer] in [place] for CPAP supplies as well as a new machine.  I called the company and spoke with Elzie Rings to confirm the request.  They stated the patient requested a new machine after 5 years.  I clarified that the patient's set up date for BiPAP ST was 11/04/2019 so he would not be due until December 2025.  We can authorize supplies.  Gretchen verbalized understanding and will fax [place] an updated order form as well as advise the patient to give [place] a call. ?

## 2022-02-24 NOTE — Telephone Encounter (Signed)
AJT Elzie Rings) call to verify received prescription. Would like a call from the nurse. Contact info:[phone] ?

## 2022-02-27 NOTE — Telephone Encounter (Signed)
Spoke with a representative with [employer] and advised we did not get the updated order request for CPAP supplies.  They will fax to [place] again.  They need chart notes with the order form. ?

## 2022-02-28 ENCOUNTER — Ambulatory Visit: Payer: BLUE CROSS/BLUE SHIELD | Admitting: Registered Nurse

## 2022-02-28 ENCOUNTER — Encounter: Payer: Self-pay | Admitting: Registered Nurse

## 2022-02-28 ENCOUNTER — Other Ambulatory Visit: Payer: Self-pay

## 2022-02-28 VITALS — BP 137/83 | HR 84 | Temp 98.3°F | Resp 18 | Ht 65.0 in | Wt 212.8 lb

## 2022-02-28 DIAGNOSIS — I119 Hypertensive heart disease without heart failure: Secondary | ICD-10-CM | POA: Diagnosis not present

## 2022-02-28 DIAGNOSIS — J302 Other seasonal allergic rhinitis: Secondary | ICD-10-CM | POA: Diagnosis not present

## 2022-02-28 DIAGNOSIS — E782 Mixed hyperlipidemia: Secondary | ICD-10-CM | POA: Diagnosis not present

## 2022-02-28 DIAGNOSIS — I10 Essential (primary) hypertension: Secondary | ICD-10-CM | POA: Diagnosis not present

## 2022-02-28 MED ORDER — AZELASTINE HCL 0.1 % NA SOLN: 1.0000 | Freq: Two times a day (BID) | NASAL | 12 refills | Status: DC

## 2022-02-28 MED ORDER — OLOPATADINE HCL 0.1 % OP SOLN: 1.0000 [drp] | Freq: Two times a day (BID) | OPHTHALMIC | 12 refills | Status: AC

## 2022-02-28 MED ORDER — ROSUVASTATIN CALCIUM 20 MG PO TABS: 20.0000 mg | ORAL_TABLET | Freq: Every day | ORAL | 1 refills | Status: DC

## 2022-02-28 MED ORDER — OLMESARTAN-AMLODIPINE-HCTZ 40-10-25 MG PO TABS: 1.0000 | ORAL_TABLET | Freq: Every day | ORAL | 1 refills | Status: DC

## 2022-02-28 NOTE — Patient Instructions (Addendum)
Eric Obrien -  ? ?Great to see you ? ?Everything's looking good ? ?Eye drops and nasal spray sent  ? ?See you in 6 mo ? ?Thanks, ? ?Eric Obrien  ? ? ? ?If you have lab work done today you will be contacted with your lab results within the next 2 weeks.  If you have not heard from [place] then please contact us. The fastest way to get your results is to register for My Chart. ? ? ?IF you received an x-ray today, you will receive an invoice from [facility]. Please contact [facility] at [phone] with questions or concerns regarding your invoice.  ? ?IF you received labwork today, you will receive an invoice from [place]. Please contact [facility] at [phone] with questions or concerns regarding your invoice.  ? ?Our billing staff will not be able to assist you with questions regarding bills from these companies. ? ?You will be contacted with the lab results as soon as they are available. The fastest way to get your results is to activate your My Chart account. Instructions are located on the last page of this paperwork. If you have not heard from [place] regarding the results in 2 weeks, please contact this office. ?  ? ? ?

## 2022-02-28 NOTE — Assessment & Plan Note (Signed)
Controlled with current regimen. Continue. Labs at next visit in 6 mo ?

## 2022-02-28 NOTE — Telephone Encounter (Signed)
CPAP supply order received.  To MM/NP for review and signature.  ?

## 2022-02-28 NOTE — Progress Notes (Signed)
? ?Established Patient Office Visit ? ?Subjective:  ?Patient ID: Eric Obrien, male    DOB: 1965-12-10  Age: 56 y.o. MRN: [id number] ? ?CC:  ?Chief Complaint  ?Patient presents with  ? Follow-up  ?  Patient states he is here for a 3 month follow up. Patient is also having some problems with his allergies  ? ? ?HPI ?Eric Obrien presents for 3 mo follow up  ? ?Hypertension: ?Patient Currently taking: olmesartan-amlodipine-hctz 40-10-'25mg'$  po qd ?Good effect. No AEs. ?Denies CV symptoms including: chest pain, shob, doe, headache, visual changes, fatigue, claudication, and dependent edema.  ? ?Previous readings and labs: ?BP Readings from Last 3 Encounters:  ?02/28/22 137/83  ?10/25/21 (!) 142/77  ?09/09/21 (!) 148/75  ? ?Lab Results  ?Component Value Date  ? CREATININE 1.23 10/25/2021  ? ?Notes allergies ?Ongoing. OTC ineffective ?Itching eyes, nocturnal nasal congestion ? ? ?Outpatient Medications Prior to Visit  ?Medication Sig Dispense Refill  ? Blood Pressure Monitoring (BLOOD PRESSURE CUFF) MISC 1 each by Does not apply route daily. 1 each 0  ? fluocinonide ointment (LIDEX) 5.85 % Apply 1 application topically 2 (two) times daily. 30 g 0  ? hydrOXYzine (ATARAX) 10 MG tablet TAKE 1 TABLET(10 MG) BY MOUTH THREE TIMES DAILY AS NEEDED 30 tablet 0  ? RESTASIS 0.05 % ophthalmic emulsion     ? Olmesartan-amLODIPine-HCTZ 40-10-25 MG TABS Take 1 tablet by mouth daily. 90 tablet 0  ? rosuvastatin (CRESTOR) 20 MG tablet Take 1 tablet (20 mg total) by mouth daily. 90 tablet 0  ? ?Facility-Administered Medications Prior to Visit  ?Medication Dose Route Frequency Provider Last Rate Last Admin  ? 0.9 %  sodium chloride infusion  500 mL Intravenous Once Armbruster, Carlota Raspberry, MD      ? ? ?Review of Systems ? ?  ?Objective:  ?  ? ?BP 137/83   Pulse 84   Temp 98.3 ?F (36.8 ?C) (Temporal)   Resp 18   Ht '5\' 5"'$  (1.651 m)   Wt 212 lb 12.8 oz (96.5 kg)   SpO2 97%   BMI 35.41 kg/m?  ? ?Wt Readings from Last 3 Encounters:  ?02/28/22  212 lb 12.8 oz (96.5 kg)  ?10/25/21 207 lb (93.9 kg)  ?09/09/21 202 lb 12.8 oz (92 kg)  ? ?Physical Exam ? ?No results found for any visits on 02/28/22. ? ? ? ?The 10-year ASCVD risk score (Arnett DK, et al., 2019) is: 7.5% ? ?  ?Assessment & Plan:  ? ?Problem List Items Addressed This Visit   ? ?  ? Cardiovascular and Mediastinum  ? Hypertensive heart disease without CHF (Chronic)  ?  Controlled with current regimen. Continue. Labs at next visit in 6 mo ?  ?  ? Relevant Medications  ? rosuvastatin (CRESTOR) 20 MG tablet  ? Olmesartan-amLODIPine-HCTZ 40-10-25 MG TABS  ?  ? Other  ? Hyperlipidemia  ?  Stable. Continue statin. Recheck in 6 mo ? ?  ?  ? Relevant Medications  ? rosuvastatin (CRESTOR) 20 MG tablet  ? Olmesartan-amLODIPine-HCTZ 40-10-25 MG TABS  ? Seasonal allergies - Primary  ?  Add azelastine nasal spray and olopatadine eye drops daily prn. Continue otc antihistamine. Ok to use flonase.  ?  ?  ? Relevant Medications  ? olopatadine (PATANOL) 0.1 % ophthalmic solution  ? azelastine (ASTELIN) 0.1 % nasal spray  ? ?Other Visit Diagnoses   ? ? Essential hypertension      ? Relevant Medications  ? rosuvastatin (CRESTOR) 20 MG tablet  ?  Olmesartan-amLODIPine-HCTZ 40-10-25 MG TABS  ? ?  ? ? ?Meds ordered this encounter  ?Medications  ? rosuvastatin (CRESTOR) 20 MG tablet  ?  Sig: Take 1 tablet (20 mg total) by mouth daily.  ?  Dispense:  90 tablet  ?  Refill:  1  ?  Order Specific Question:   Supervising Provider  ?  Answer:   Carlota Raspberry, JEFFREY R [[id number]]  ? Olmesartan-amLODIPine-HCTZ 40-10-25 MG TABS  ?  Sig: Take 1 tablet by mouth daily.  ?  Dispense:  90 tablet  ?  Refill:  1  ?  Order Specific Question:   Supervising Provider  ?  Answer:   Carlota Raspberry, JEFFREY R [[id number]]  ? olopatadine (PATANOL) 0.1 % ophthalmic solution  ?  Sig: Place 1 drop into both eyes 2 (two) times daily.  ?  Dispense:  5 mL  ?  Refill:  12  ?  Order Specific Question:   Supervising Provider  ?  Answer:   Carlota Raspberry, JEFFREY R [[id number]]  ? azelastine  (ASTELIN) 0.1 % nasal spray  ?  Sig: Place 1 spray into both nostrils 2 (two) times daily. Use in each nostril as directed  ?  Dispense:  30 mL  ?  Refill:  12  ?  Order Specific Question:   Supervising Provider  ?  Answer:   Carlota Raspberry, JEFFREY R [[id number]]  ? ? ?Return in about 6 months (around 08/30/2022) for Chronic Conditions.  ? ? ?Maximiano Coss, NP ?

## 2022-02-28 NOTE — Assessment & Plan Note (Signed)
Stable. Continue statin. Recheck in 6 mo ? ?

## 2022-02-28 NOTE — Assessment & Plan Note (Signed)
Add azelastine nasal spray and olopatadine eye drops daily prn. Continue otc antihistamine. Ok to use flonase.  ?

## 2022-03-07 NOTE — Telephone Encounter (Signed)
We received another order sheet from [facility] in [facility] for CPAP equipment request, not general supplies (the supplies order was faxed last week).  The patient should not be eligible for a new machine until December 2025 as noted below.  I tried to reach the patient to inquire the reason for the machine request.  LVM (ok per DPR) advising patient that insurance would not pay for a new machine yet and I wanted to see if he had requested the machine and what was going on.  Left office number in message for call back when office is open tomorrow. ?

## 2022-03-07 NOTE — Telephone Encounter (Signed)
Late entry: the CPAP supply orders were faxed to [facility] last Thursday. Received a receipt of confirmation. ? ?

## 2022-03-08 NOTE — Telephone Encounter (Signed)
Spoke to pt and he stated that he does get his supplies thru them at [facility].  He has spoken to them and does not need new machine.  The fax that we have is for cpap machine.  He only needs nasal pillows. I relayed will fax message to them.  He appreciated this.  Done.  ?

## 2022-03-09 NOTE — Telephone Encounter (Signed)
John from [facility] has called to report the that order originally had Dr Guadelupe Sabin name on it but that was marked out.  John states everywhere there was a Marine scientist, NP needs to initial and date ?John also asking for provider to sign off on  new mask and CPAP machine for pt. ?[facility] ?[place] [phone], fax [phone] ?

## 2022-03-09 NOTE — Telephone Encounter (Signed)
Spoke with rep at [facility]. They will fax a new, updated order for Dr Rexene Alberts to sign today since [place] NP is out of the office. I clarified with him that pt does not need a new machine, just the supplies, the nasal pillow. ?

## 2022-03-13 ENCOUNTER — Other Ambulatory Visit: Payer: Self-pay | Admitting: Registered Nurse

## 2022-03-13 DIAGNOSIS — I10 Essential (primary) hypertension: Secondary | ICD-10-CM

## 2022-03-13 NOTE — Telephone Encounter (Signed)
Rx for CPAP supplies has been faxed to [facility] [phone]. Received a receipt of confirmation. Spoke with Regen at [facility] and made him aware that prescription was faxed.  ?

## 2022-03-13 NOTE — Telephone Encounter (Signed)
Patient would like a medication refill on Amlodipine 10 mg. The medication was discontinued on 08/05/2021 is this refill appropriate ?

## 2022-03-13 NOTE — Telephone Encounter (Signed)
[facility] Albina Billet) following up on prescription form for CPAP supplies and CPAP machine replacement, heated tubing. We faxed form 03/01/22 to [phone] Would like a call from the nurse. ? ?Contact info: [phone] ?

## 2022-03-14 NOTE — Telephone Encounter (Signed)
Megan NP initialed those areas and we faxed back to [facility]. Received a receipt of confirmation. ? ?

## 2022-03-14 NOTE — Telephone Encounter (Signed)
[facility] ([place]) requesting prescription for CPAP machine, heated tubing for the chambers. Received prescription and old physician and NPI was crossed out. Need physician's initial and date by physician signing the prescription. Going to refax the request. Would like a call from the nurse. ? ?Contact info: [phone] ?

## 2022-03-27 ENCOUNTER — Ambulatory Visit (INDEPENDENT_AMBULATORY_CARE_PROVIDER_SITE_OTHER): Payer: BLUE CROSS/BLUE SHIELD | Admitting: Adult Health

## 2022-03-27 ENCOUNTER — Encounter: Payer: Self-pay | Admitting: Adult Health

## 2022-03-27 VITALS — BP 146/85 | HR 86 | Ht 65.0 in | Wt 220.0 lb

## 2022-03-27 DIAGNOSIS — Z9989 Dependence on other enabling machines and devices: Secondary | ICD-10-CM

## 2022-03-27 DIAGNOSIS — G4733 Obstructive sleep apnea (adult) (pediatric): Secondary | ICD-10-CM

## 2022-03-27 NOTE — Progress Notes (Signed)
? ? ?PATIENT: Eric Obrien ?DOB: 1966/10/08 ? ?REASON FOR VISIT: follow up ?HISTORY FROM: patient ?PRIMARY NEUROLOGIST: Dr. Rexene Alberts ? ?HISTORY OF PRESENT ILLNESS: ?Today 03/27/22 ? ?Eric Obrien is a 56 year old male with a history of obstructive sleep apnea on CPAP.  He returns today for follow-up.  He reports that the mask leaks every night.  Therefore he is unable to use it for very long.  He also states that the water tank is broken.  His download is below ? ? ? ?REVIEW OF SYSTEMS: Out of a complete 14 system review of symptoms, the patient complains only of the following symptoms, and all other reviewed systems are negative. ? ?ESS 16 ? ?ALLERGIES: ?No Known Allergies ? ?HOME MEDICATIONS: ?Outpatient Medications Prior to Visit  ?Medication Sig Dispense Refill  ? azelastine (ASTELIN) 0.1 % nasal spray Place 1 spray into both nostrils 2 (two) times daily. Use in each nostril as directed 30 mL 12  ? Blood Pressure Monitoring (BLOOD PRESSURE CUFF) MISC 1 each by Does not apply route daily. 1 each 0  ? fluocinonide ointment (LIDEX) 0.73 % Apply 1 application topically 2 (two) times daily. 30 g 0  ? hydrOXYzine (ATARAX) 10 MG tablet TAKE 1 TABLET(10 MG) BY MOUTH THREE TIMES DAILY AS NEEDED 30 tablet 0  ? Olmesartan-amLODIPine-HCTZ 40-10-25 MG TABS Take 1 tablet by mouth daily. 90 tablet 1  ? olopatadine (PATANOL) 0.1 % ophthalmic solution Place 1 drop into both eyes 2 (two) times daily. 5 mL 12  ? RESTASIS 0.05 % ophthalmic emulsion     ? rosuvastatin (CRESTOR) 20 MG tablet Take 1 tablet (20 mg total) by mouth daily. 90 tablet 1  ? ?Facility-Administered Medications Prior to Visit  ?Medication Dose Route Frequency Provider Last Rate Last Admin  ? 0.9 %  sodium chloride infusion  500 mL Intravenous Once Armbruster, Carlota Raspberry, MD      ? ? ?PAST MEDICAL HISTORY: ?Past Medical History:  ?Diagnosis Date  ? Hyperlipidemia   ? on meds  ? Hypertension   ? on meds  ? Sleep apnea   ? uses CPAP  ? ? ?PAST SURGICAL HISTORY: ?Past  Surgical History:  ?Procedure Laterality Date  ? NO PAST SURGERIES    ? ? ?FAMILY HISTORY: ?Family History  ?Problem Relation Age of Onset  ? Crohn's disease Other   ? Colon cancer Neg Hx   ? Esophageal cancer Neg Hx   ? Colon polyps Neg Hx   ? Stomach cancer Neg Hx   ? Rectal cancer Neg Hx   ? ? ?SOCIAL HISTORY: ?Social History  ? ?Socioeconomic History  ? Marital status: Married  ?  Spouse name: Not on file  ? Number of children: 2  ? Years of education: Not on file  ? Highest education level: Not on file  ?Occupational History  ? Occupation: [employer]  ?Tobacco Use  ? Smoking status: Never  ? Smokeless tobacco: Never  ?Vaping Use  ? Vaping Use: Never used  ?Substance and Sexual Activity  ? Alcohol use: Not Currently  ?  Alcohol/week: 2.0 standard drinks  ?  Types: 2 Standard drinks or equivalent per week  ?  Comment: rarely  ? Drug use: No  ? Sexual activity: Not Currently  ?Other Topics Concern  ? Not on file  ?Social History Narrative  ? Lives at home with spouse and children  ? Right handed  ? Caffeine: about 8 cups/day of coffee  ? ?Social Determinants of Health  ? ?Emergency planning/management officer  Strain: Not on file  ?Food Insecurity: Not on file  ?Transportation Needs: Not on file  ?Physical Activity: Not on file  ?Stress: Not on file  ?Social Connections: Not on file  ?Intimate Partner Violence: Not on file  ? ? ? ? ?PHYSICAL EXAM ? ?Vitals:  ? 03/27/22 1440  ?BP: (!) 146/85  ?Pulse: 86  ?Weight: 220 lb (99.8 kg)  ?Height: '5\' 5"'$  (1.651 m)  ? ?Body mass index is 36.61 kg/m?. ? ?Generalized: Well developed, in no acute distress  ?Chest: Lungs clear to auscultation bilaterally ? ?Neurological examination  ?Mentation: Alert oriented to time, place, history taking. Follows all commands speech and language fluent ?Cranial nerve II-XII: Extraocular movements were full, visual field were full on confrontational test Head turning and shoulder shrug  were normal and symmetric. ?Motor: The motor testing reveals 5 over 5 strength of  all 4 extremities. Good symmetric motor tone is noted throughout.  ?Sensory: Sensory testing is intact to soft touch on all 4 extremities. No evidence of extinction is noted.  ?Gait and station: Gait is normal.  ? ? ?DIAGNOSTIC DATA (LABS, IMAGING, TESTING) ?- I reviewed patient records, labs, notes, testing and imaging myself where available. ? ?Lab Results  ?Component Value Date  ? WBC 7.3 08/05/2021  ? HGB 13.3 08/05/2021  ? HCT 40.3 08/05/2021  ? MCV 84.1 08/05/2021  ? PLT 219.0 08/05/2021  ? ?   ?Component Value Date/Time  ? NA 139 10/25/2021 1033  ? NA 141 01/31/2021 0000  ? K 3.6 10/25/2021 1033  ? CL 100 10/25/2021 1033  ? CO2 33 (H) 10/25/2021 1033  ? GLUCOSE 131 (H) 10/25/2021 1033  ? BUN 15 10/25/2021 1033  ? BUN 15 01/31/2021 0000  ? CREATININE 1.23 10/25/2021 1033  ? CREATININE 1.14 10/07/2015 1956  ? CALCIUM 9.7 10/25/2021 1033  ? PROT 7.2 10/25/2021 1033  ? PROT 7.2 01/31/2021 0000  ? ALBUMIN 4.4 10/25/2021 1033  ? ALBUMIN 4.4 01/31/2021 0000  ? AST 24 10/25/2021 1033  ? ALT 24 10/25/2021 1033  ? ALKPHOS 68 10/25/2021 1033  ? BILITOT 0.5 10/25/2021 1033  ? BILITOT 0.3 01/31/2021 0000  ? GFRNONAA 75 09/27/2018 1602  ? GFRNONAA 85 06/15/2015 1120  ? GFRAA 87 09/27/2018 1602  ? GFRAA >89 06/15/2015 1120  ? ?Lab Results  ?Component Value Date  ? CHOL 192 10/25/2021  ? HDL 42.60 10/25/2021  ? LDLCALC 103 (H) 09/27/2018  ? LDLDIRECT 115.0 10/25/2021  ? TRIG 246.0 (H) 10/25/2021  ? CHOLHDL 5 10/25/2021  ? ?Lab Results  ?Component Value Date  ? HGBA1C 5.9 08/05/2021  ? ?No results found for: VITAMINB12 ?Lab Results  ?Component Value Date  ? TSH 1.18 08/05/2021  ? ? ? ? ?ASSESSMENT AND PLAN ?56 y.o. year old male  has a past medical history of Hyperlipidemia, Hypertension, and Sleep apnea. here with: ? ?OSA on CPAP ? ?- CPAP compliance suboptimal ?-Residual AHI slightly elevated most likely due to leakage ?-Order sent for mask refitting ?- Encourage patient to use CPAP nightly and > 4 hours each night ?- F/U  in 1 year or sooner if needed ? ? ? ?Ward Givens, MSN, NP-C 03/27/2022, 3:32 PM ?[facility] ?[address], [address] ?[place], [place] [address] ?[phone] ? ? ?

## 2022-03-28 ENCOUNTER — Other Ambulatory Visit: Payer: Self-pay | Admitting: Registered Nurse

## 2022-03-28 DIAGNOSIS — L299 Pruritus, unspecified: Secondary | ICD-10-CM

## 2022-04-18 ENCOUNTER — Telehealth: Payer: Self-pay | Admitting: Adult Health

## 2022-04-18 NOTE — Telephone Encounter (Signed)
[facility], [place] page from the prescription and need current chart notes stating pt using CPAP machine.Requesting resend prescription

## 2022-04-18 NOTE — Telephone Encounter (Signed)
Spoke with [facility] [phone]). Pt isn't due for new machine yet. I was also told they have a complete chart. They will call the pt to clarify and then call us back.

## 2022-04-30 ENCOUNTER — Other Ambulatory Visit: Payer: Self-pay | Admitting: Registered Nurse

## 2022-04-30 DIAGNOSIS — L299 Pruritus, unspecified: Secondary | ICD-10-CM

## 2022-05-01 NOTE — Telephone Encounter (Signed)
Patient is requesting a refill of the following medications: Requested Prescriptions   Pending Prescriptions Disp Refills   hydrOXYzine (ATARAX) 10 MG tablet [Pharmacy Med Name: HYDROXYZINE HCL '10MG'$  TABLETS] 30 tablet 0    Sig: TAKE 1 TABLET(10 MG) BY MOUTH THREE TIMES DAILY AS NEEDED    Date of patient request: 05/01/2022 Last office visit: 02/28/2022 Date of last refill: 03/28/2022 Last refill amount: 30 tablets  Follow up time period per chart: 08/29/2022

## 2022-05-08 ENCOUNTER — Ambulatory Visit (INDEPENDENT_AMBULATORY_CARE_PROVIDER_SITE_OTHER): Payer: BLUE CROSS/BLUE SHIELD | Admitting: Family Medicine

## 2022-05-08 ENCOUNTER — Encounter: Payer: Self-pay | Admitting: Family Medicine

## 2022-05-08 VITALS — BP 132/84 | HR 74 | Temp 98.0°F | Resp 16 | Ht 65.0 in | Wt 218.0 lb

## 2022-05-08 DIAGNOSIS — H6981 Other specified disorders of Eustachian tube, right ear: Secondary | ICD-10-CM

## 2022-05-08 MED ORDER — FLUTICASONE PROPIONATE 50 MCG/ACT NA SUSP: 2.0000 | Freq: Every day | NASAL | 6 refills | Status: DC

## 2022-05-08 MED ORDER — CETIRIZINE HCL 10 MG PO TABS: 10.0000 mg | ORAL_TABLET | Freq: Every day | ORAL | 11 refills | Status: AC

## 2022-05-08 NOTE — Patient Instructions (Addendum)
Follow up as needed or as scheduled Thankfully your ear is not infected CONTINUE the Astelin nasal spray ADD the Flonase nasal spray- 2 sprays each nostril daily START a daily antihistamine like Claritin or Zyrtec- one a day Drink LOTS of water Call with any questions or concerns Hang in there!!!

## 2022-05-08 NOTE — Progress Notes (Signed)
   Subjective:    Patient ID: Eric Obrien, male    DOB: 1966-07-12, 56 y.o.   MRN: [id number]  HPI Ear pain- R sided.  Started ~3 days ago.  No fevers.  No drainage.  Some dizziness at times.  No pain on L.  Pain is not external but deep.  No hx of similar.  Denies congestion/sinus pressure.  No travel.  No recent swimming.  Currently on Astelin.  Pt has hx of seasonal allergies.  Not currently on daily antihistamine   Review of Systems For ROS see HPI     Objective:   Physical Exam Vitals reviewed.  Constitutional:      General: He is not in acute distress.    Appearance: Normal appearance. He is well-developed. He is not ill-appearing.  HENT:     Head: Normocephalic and atraumatic.     Right Ear: Ear canal normal. Tympanic membrane is retracted.     Left Ear: Tympanic membrane and ear canal normal.     Nose: Mucosal edema and congestion present.     Right Turbinates: Not swollen.     Left Turbinates: Not swollen.  Eyes:     Conjunctiva/sclera: Conjunctivae normal.     Pupils: Pupils are equal, round, and reactive to light.  Cardiovascular:     Rate and Rhythm: Normal rate and regular rhythm.     Heart sounds: Normal heart sounds.  Pulmonary:     Effort: Pulmonary effort is normal. No respiratory distress.     Breath sounds: Normal breath sounds. No wheezing.  Musculoskeletal:     Cervical back: Normal range of motion and neck supple.  Lymphadenopathy:     Cervical: No cervical adenopathy.  Skin:    General: Skin is warm and dry.  Neurological:     Mental Status: He is alert.           Assessment & Plan:  Eustachian tube dysfxn- new.  Pt's sxs and PE consistent w/ dx.  Pt knows he has difficult seasonal allergies but is not taking anything other than using the Astelin nasal spray PRN.  Encouraged regular use and will add Flonase and Zyrtec.  Reviewed supportive care and red flags that should prompt return.  Pt expressed understanding and is in agreement w/ plan.

## 2022-06-09 ENCOUNTER — Other Ambulatory Visit: Payer: Self-pay | Admitting: Registered Nurse

## 2022-06-09 DIAGNOSIS — L299 Pruritus, unspecified: Secondary | ICD-10-CM

## 2022-07-14 ENCOUNTER — Other Ambulatory Visit: Payer: Self-pay

## 2022-07-14 DIAGNOSIS — L299 Pruritus, unspecified: Secondary | ICD-10-CM

## 2022-07-14 MED ORDER — HYDROXYZINE HCL 10 MG PO TABS: ORAL_TABLET | ORAL | 4 refills | Status: DC

## 2022-08-29 ENCOUNTER — Ambulatory Visit: Payer: BLUE CROSS/BLUE SHIELD | Admitting: Registered Nurse

## 2022-09-06 ENCOUNTER — Other Ambulatory Visit (HOSPITAL_BASED_OUTPATIENT_CLINIC_OR_DEPARTMENT_OTHER): Payer: Self-pay

## 2022-09-06 ENCOUNTER — Encounter: Payer: Self-pay | Admitting: Medical

## 2022-09-06 ENCOUNTER — Ambulatory Visit (INDEPENDENT_AMBULATORY_CARE_PROVIDER_SITE_OTHER): Payer: 59 | Admitting: Medical

## 2022-09-06 ENCOUNTER — Telehealth: Payer: Self-pay

## 2022-09-06 VITALS — BP 160/76 | HR 84 | Temp 98.0°F | Resp 18 | Ht 65.0 in | Wt 214.6 lb

## 2022-09-06 DIAGNOSIS — G4730 Sleep apnea, unspecified: Secondary | ICD-10-CM | POA: Diagnosis not present

## 2022-09-06 DIAGNOSIS — E782 Mixed hyperlipidemia: Secondary | ICD-10-CM | POA: Diagnosis not present

## 2022-09-06 DIAGNOSIS — D1722 Benign lipomatous neoplasm of skin and subcutaneous tissue of left arm: Secondary | ICD-10-CM

## 2022-09-06 DIAGNOSIS — I10 Essential (primary) hypertension: Secondary | ICD-10-CM

## 2022-09-06 DIAGNOSIS — R739 Hyperglycemia, unspecified: Secondary | ICD-10-CM | POA: Diagnosis not present

## 2022-09-06 DIAGNOSIS — R5383 Other fatigue: Secondary | ICD-10-CM

## 2022-09-06 LAB — LIPID PANEL
Cholesterol: 166 mg/dL (ref 0–200)
HDL: 43.6 mg/dL (ref >39.00)
LDL Cholesterol: 95 mg/dL (ref 0–99)
NonHDL: 121.95
Total CHOL/HDL Ratio: 4
Triglycerides: 133 mg/dL (ref 0.0–149.0)
VLDL: 26.6 mg/dL (ref 0.0–40.0)

## 2022-09-06 LAB — COMPREHENSIVE METABOLIC PANEL
ALT: 39 U/L (ref 0–53)
AST: 35 U/L (ref 0–37)
Albumin: 4.3 g/dL (ref 3.5–5.2)
Alkaline Phosphatase: 75 U/L (ref 39–117)
BUN: 14 mg/dL (ref 6–23)
CO2: 31 mEq/L (ref 19–32)
Calcium: 9.6 mg/dL (ref 8.4–10.5)
Chloride: 101 mEq/L (ref 96–112)
Creatinine, Ser: 1.18 mg/dL (ref 0.40–1.50)
GFR: 69.52 mL/min (ref >60.00)
Glucose, Bld: 113 mg/dL — ABNORMAL HIGH (ref 70–99)
Potassium: 3.3 mEq/L — ABNORMAL LOW (ref 3.5–5.1)
Sodium: 139 mEq/L (ref 135–145)
Total Bilirubin: 0.5 mg/dL (ref 0.2–1.2)
Total Protein: 7.4 g/dL (ref 6.0–8.3)

## 2022-09-06 LAB — CBC WITH DIFFERENTIAL/PLATELET
Basophils Absolute: 0 10*3/uL (ref 0.0–0.1)
Basophils Relative: 0.6 % (ref 0.0–3.0)
Eosinophils Absolute: 0.1 10*3/uL (ref 0.0–0.7)
Eosinophils Relative: 1.5 % (ref 0.0–5.0)
HCT: 42.9 % (ref 39.0–52.0)
Hemoglobin: 14 g/dL (ref 13.0–17.0)
Lymphocytes Relative: 48.7 % — ABNORMAL HIGH (ref 12.0–46.0)
Lymphs Abs: 3.6 10*3/uL (ref 0.7–4.0)
MCHC: 32.7 g/dL (ref 30.0–36.0)
MCV: 83.6 fl (ref 78.0–100.0)
Monocytes Absolute: 0.5 10*3/uL (ref 0.1–1.0)
Monocytes Relative: 7.3 % (ref 3.0–12.0)
Neutro Abs: 3.1 10*3/uL (ref 1.4–7.7)
Neutrophils Relative %: 41.9 % — ABNORMAL LOW (ref 43.0–77.0)
Platelets: 234 10*3/uL (ref 150.0–400.0)
RBC: 5.13 Mil/uL (ref 4.22–5.81)
RDW: 13 % (ref 11.5–15.5)
WBC: 7.5 10*3/uL (ref 4.0–10.5)

## 2022-09-06 LAB — HEMOGLOBIN A1C: Hgb A1c MFr Bld: 6.3 % (ref 4.6–6.5)

## 2022-09-06 LAB — T4, FREE: Free T4: 0.65 ng/dL (ref 0.60–1.60)

## 2022-09-06 LAB — TSH: TSH: 1.15 u[IU]/mL (ref 0.35–5.50)

## 2022-09-06 LAB — VITAMIN B12: Vitamin B-12: 818 pg/mL (ref 211–911)

## 2022-09-06 MED ORDER — OLMESARTAN-AMLODIPINE-HCTZ 40-10-25 MG PO TABS: 1.0000 | ORAL_TABLET | Freq: Every day | ORAL | 1 refills | Status: DC

## 2022-09-06 MED ORDER — OLMESARTAN MEDOXOMIL-HCTZ 40-25 MG PO TABS: 1.0000 | ORAL_TABLET | Freq: Every day | ORAL | 1 refills | Status: DC

## 2022-09-06 MED ORDER — AMLODIPINE BESYLATE 10 MG PO TABS: 10.0000 mg | ORAL_TABLET | Freq: Every day | ORAL | 3 refills | Status: DC

## 2022-09-06 NOTE — Progress Notes (Signed)
Subjective:    Patient ID: Eric Obrien, male    DOB: Nov 10, 1966, 56 y.o.   MRN: [id number]  HPI  Pt in for first time with me but had been seeing other [place] office.  Pt from [place]/lived her for 24 hours.  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 He has small gym in basement and works out 25-30 minutes.    Htn. Pt is on olmesartan-amlodipine -hctz. Off med for 20 days. No motor or sesnory deficits but does feel tired.   High cholesterol- on crestor 20 mg daily.  Seasonal allergies- on flonase now. Will add zyrtec if allergies flare.  Elevated sugar in past but not diabetic.  Pt only had coffee ths morning.    Review of Systems  Constitutional:  Positive for fatigue. Negative for chills and fever.       Tired for 2 weeks but had insomnia last night. Slept 2 hours.  HENT:  Negative for congestion and drooling.   Respiratory:  Negative for cough, chest tightness, shortness of breath and wheezing.   Cardiovascular:  Negative for chest pain and palpitations.  Gastrointestinal:  Negative for abdominal pain.  Musculoskeletal:  Negative for back pain, joint swelling, neck pain and neck stiffness.  Skin:  Negative for rash.  Neurological:  Negative for dizziness, speech difficulty, weakness, light-headedness, numbness and headaches.  Hematological:  Negative for adenopathy.  Psychiatric/Behavioral:  Negative for behavioral problems, confusion and decreased concentration.    Past Medical History:  Diagnosis Date   Hyperlipidemia    on meds   Hypertension    on meds   Sleep apnea    uses CPAP     Social History   Socioeconomic History   Marital status: Married    Spouse name: Not on file   Number of children: 2   Years of education: Not on file   Highest education level: Not on file  Occupational History   Occupation: [employer]  Tobacco Use   Smoking status: Never   Smokeless tobacco: Never  Vaping Use   Vaping Use: Never used  Substance and Sexual Activity    Alcohol use: Not Currently    Alcohol/week: 2.0 standard drinks of alcohol    Types: 2 Standard drinks or equivalent per week    Comment: rarely   Drug use: No   Sexual activity: Not Currently  Other Topics Concern   Not on file  Social History Narrative   Lives at home with spouse and children   Right handed   Caffeine: about 8 cups/day of coffee   Social Determinants of Health   Financial Resource Strain: Low Risk  (06/20/2019)   Overall Financial Resource Strain (CARDIA)    Difficulty of Paying Living Expenses: Not hard at all  Food Insecurity: No Food Insecurity (06/20/2019)   Hunger Vital Sign    Worried About Running Out of Food in the Last Year: Never true    Ran Out of Food in the Last Year: Never true  Transportation Needs: No Transportation Needs (06/20/2019)   PRAPARE - Hydrologist (Medical): No    Lack of Transportation (Non-Medical): No  Physical Activity: Sufficiently Active (06/20/2019)   Exercise Vital Sign    Days of Exercise per Week: 5 days    Minutes of Exercise per Session: 50 min  Stress: Stress Concern Present (06/20/2019)   [place]    Feeling of Stress : To some extent  Social Connections: Unknown (06/20/2019)   Social Connection and Isolation Panel [NHANES]    Frequency of Communication with Friends and Family: Three times a week    Frequency of Social Gatherings with Friends and Family: Twice a week    Attends Religious Services: Patient refused    Active Member of Clubs or Organizations: Patient refused    Attends Archivist Meetings: Patient refused    Marital Status: Patient refused  Intimate Partner Violence: Not At Risk (06/20/2019)   Humiliation, Afraid, Rape, and Kick questionnaire    Fear of Current or Ex-Partner: No    Emotionally Abused: No    Physically Abused: No    Sexually Abused: No    Past Surgical History:  Procedure  Laterality Date   NO PAST SURGERIES      Family History  Problem Relation Age of Onset   Crohn's disease Other    Colon cancer Neg Hx    Esophageal cancer Neg Hx    Colon polyps Neg Hx    Stomach cancer Neg Hx    Rectal cancer Neg Hx     No Known Allergies  Current Outpatient Medications on File Prior to Visit  Medication Sig Dispense Refill   amLODipine (NORVASC) 10 MG tablet Take 10 mg by mouth daily.     azelastine (ASTELIN) 0.1 % nasal spray Place 1 spray into both nostrils 2 (two) times daily. Use in each nostril as directed 30 mL 12   Blood Pressure Monitoring (BLOOD PRESSURE CUFF) MISC 1 each by Does not apply route daily. 1 each 0   cetirizine (ZYRTEC) 10 MG tablet Take 1 tablet (10 mg total) by mouth daily. 30 tablet 11   fluocinonide ointment (LIDEX) 9.92 % Apply 1 application topically 2 (two) times daily. 30 g 0   fluticasone (FLONASE) 50 MCG/ACT nasal spray Place 2 sprays into both nostrils daily. 16 g 6   hydrOXYzine (ATARAX) 10 MG tablet TAKE 1 TABLET(10 MG) BY MOUTH THREE TIMES DAILY AS NEEDED 30 tablet 4   Olmesartan-amLODIPine-HCTZ 40-10-25 MG TABS Take 1 tablet by mouth daily. 90 tablet 1   olopatadine (PATANOL) 0.1 % ophthalmic solution Place 1 drop into both eyes 2 (two) times daily. 5 mL 12   RESTASIS 0.05 % ophthalmic emulsion      rosuvastatin (CRESTOR) 20 MG tablet Take 1 tablet (20 mg total) by mouth daily. 90 tablet 1   Current Facility-Administered Medications on File Prior to Visit  Medication Dose Route Frequency Provider Last Rate Last Admin   0.9 %  sodium chloride infusion  500 mL Intravenous Once Armbruster, Carlota Raspberry, MD        BP (!) 160/76   Pulse 84   Temp 98 F (36.7 C)   Resp 18   Ht '5\' 5"'$  (1.651 m)   Wt 214 lb 9.6 oz (97.3 kg)   SpO2 98%   BMI 35.71 kg/m        Objective:   Physical Exam  General Mental Status- Alert. General Appearance- Not in acute distress.   Skin General: Color- Normal Color. Moisture- Normal  Moisture.  Neck Carotid Arteries- Normal color. Moisture- Normal Moisture. No carotid bruits. No JVD.  Chest and Lung Exam Auscultation: Breath Sounds:-Normal.  Cardiovascular Auscultation:Rythm- Regular. Murmurs & Other Heart Sounds:Auscultation of the heart reveals- No Murmurs.  Abdomen Inspection:-Inspeection Normal. Palpation/Percussion:Note:No mass. Palpation and Percussion of the abdomen reveal- Non Tender, Non Distended + BS, no rebound or guarding.   Neurologic Cranial Nerve exam:- CN  III-XII intact(No nystagmus), symmetric smile.  Heal to Toe Gait exam:-Normal. Finger to Nose:- Normal/Intact Strength:- 5/5 equal and symmetric strength both upper and lower extremities.   Left forearm- mid vental aspect moderate size lipoma about 2 cm in size.     Assessment & Plan:   Patient Instructions  Htn- your bp is elevated today but normal neurologic exam. Not on your  olmesartan-amlodipine -hctz daily.  Refill today. Ask you follow up in 10 days nurse bp check to confirm controll.  High cholesterol- on crestor 20 mg daily. Get cmp and lipid panel today.   Seasonal allergies- continue flonase.   Elevated sugar get a1c today.  For sleep apnea continue cpap.  For fatigue tsh, t4, b12 and cbc.  Follow up 10 days nurse bp check. Will update you on labs.   Mackie Pai, PA-C

## 2022-09-06 NOTE — Telephone Encounter (Signed)
Pharmacy sent note stating patient needs amlodipine plus olmesartan/HCTZ plan exclusion

## 2022-09-06 NOTE — Addendum Note (Signed)
Addended by: Anabel Halon on: 09/06/2022 04:48 PM   Modules accepted: Orders

## 2022-09-06 NOTE — Telephone Encounter (Signed)
They said its not covered with his plan

## 2022-09-06 NOTE — Patient Instructions (Addendum)
Htn- your bp is elevated today but normal neurologic exam. Not on your  olmesartan-amlodipine -hctz daily.  Refill today. Ask you follow up in 10 days nurse bp check to confirm controll.  High cholesterol- on crestor 20 mg daily. Get cmp and lipid panel today.   Seasonal allergies- continue flonase.   Elevated sugar get a1c today.  For sleep apnea continue cpap.  For fatigue tsh, t4, b12 and cbc.  Placed referral to surgeon to evaluate probable lipoma.  Follow up 10 days nurse bp check. Will update you on labs.

## 2022-09-07 ENCOUNTER — Other Ambulatory Visit: Payer: Self-pay

## 2022-09-15 ENCOUNTER — Other Ambulatory Visit: Payer: Self-pay | Admitting: Medical

## 2022-09-15 ENCOUNTER — Ambulatory Visit (INDEPENDENT_AMBULATORY_CARE_PROVIDER_SITE_OTHER): Payer: 59 | Admitting: Medical

## 2022-09-15 DIAGNOSIS — I10 Essential (primary) hypertension: Secondary | ICD-10-CM

## 2022-09-15 MED ORDER — METOPROLOL SUCCINATE ER 25 MG PO TB24: 25.0000 mg | ORAL_TABLET | Freq: Every day | ORAL | 0 refills | Status: DC

## 2022-09-15 NOTE — Addendum Note (Signed)
Addended by: Anabel Halon on: 09/15/2022 02:55 PM   Modules accepted: Orders

## 2022-09-15 NOTE — Progress Notes (Cosign Needed Addendum)
Pt here for Blood pressure check per Mackie Pai PA-C: 09/06/22: "Follow up 10 days nurse bp check. Will update you on labs."   Pt currently takes: Amlodipine 10 mg, Omlesartan/hctz 40-25 mg.   Pt reports compliance with medication. Does not check BP at home.   BP today @ = 152/92 HR = 82  Second check:  156/92 HR: 83  Pt advised per Percell Miller: To purchase OTC BP cuff. Toprol- XL 25 mg called in to start use only if BP is constantly above 140/90. Will start monitoring and will inform us what his at home readings look like in 1 week. Pt aware and voices understanding.    Mackie Pai, PA-C

## 2022-10-04 ENCOUNTER — Ambulatory Visit: Payer: 59 | Admitting: Adult Health

## 2022-10-04 ENCOUNTER — Encounter: Payer: Self-pay | Admitting: Adult Health

## 2023-04-05 ENCOUNTER — Encounter: Payer: Self-pay | Admitting: Family Medicine

## 2023-04-05 ENCOUNTER — Ambulatory Visit: Payer: Managed Care, Other (non HMO) | Admitting: Family Medicine

## 2023-04-05 VITALS — BP 140/88 | HR 112 | Temp 99.7°F | Resp 18 | Ht 65.0 in | Wt 215.0 lb

## 2023-04-05 DIAGNOSIS — I10 Essential (primary) hypertension: Secondary | ICD-10-CM

## 2023-04-05 DIAGNOSIS — J302 Other seasonal allergic rhinitis: Secondary | ICD-10-CM | POA: Diagnosis not present

## 2023-04-05 DIAGNOSIS — R6889 Other general symptoms and signs: Secondary | ICD-10-CM | POA: Diagnosis not present

## 2023-04-05 DIAGNOSIS — R051 Acute cough: Secondary | ICD-10-CM | POA: Diagnosis not present

## 2023-04-05 DIAGNOSIS — J029 Acute pharyngitis, unspecified: Secondary | ICD-10-CM | POA: Diagnosis not present

## 2023-04-05 DIAGNOSIS — J0140 Acute pansinusitis, unspecified: Secondary | ICD-10-CM

## 2023-04-05 LAB — POC INFLUENZA A&B (BINAX/QUICKVUE)
Influenza A, POC: NEGATIVE
Influenza B, POC: NEGATIVE

## 2023-04-05 LAB — POCT RAPID STREP A (OFFICE): Rapid Strep A Screen: NEGATIVE

## 2023-04-05 MED ORDER — AMOXICILLIN-POT CLAVULANATE 875-125 MG PO TABS: 1.0000 | ORAL_TABLET | Freq: Two times a day (BID) | ORAL | 0 refills | Status: DC

## 2023-04-05 MED ORDER — PROMETHAZINE-DM 6.25-15 MG/5ML PO SYRP: 5.0000 mL | ORAL_SOLUTION | Freq: Four times a day (QID) | ORAL | 0 refills | Status: DC | PRN

## 2023-04-05 MED ORDER — FLUTICASONE PROPIONATE 50 MCG/ACT NA SUSP: 2.0000 | Freq: Every day | NASAL | 6 refills | Status: DC

## 2023-04-05 MED ORDER — AMLODIPINE BESYLATE 10 MG PO TABS: 10.0000 mg | ORAL_TABLET | Freq: Every day | ORAL | 3 refills | Status: DC

## 2023-04-05 MED ORDER — OLMESARTAN MEDOXOMIL-HCTZ 40-25 MG PO TABS: 1.0000 | ORAL_TABLET | Freq: Every day | ORAL | 1 refills | Status: DC

## 2023-04-05 MED ORDER — AZELASTINE HCL 0.1 % NA SOLN: 1.0000 | Freq: Two times a day (BID) | NASAL | 12 refills | Status: AC

## 2023-04-05 MED ORDER — METOPROLOL SUCCINATE ER 25 MG PO TB24: ORAL_TABLET | ORAL | 1 refills | Status: DC

## 2023-04-05 NOTE — Progress Notes (Signed)
Subjective:   By signing my name below, I, Eric Obrien, attest that this documentation has been prepared under the direction and in the presence of Donato Schultz, DO. 04/05/2023   Patient ID: Eric Obrien, male    DOB: Feb 27, 1966, 57 y.o.   MRN: [id number]  Chief Complaint  Patient presents with   Cough    Sxs started yesterday, non productive, having chills, fatigued,body aches, sore throat negative COVID last night     Cough Associated symptoms include chills and a sore throat. Pertinent negatives include no chest pain, fever, headaches, rash or shortness of breath. There is no history of environmental allergies.   Patient is in today for a office visit.   He complains of frequent, dry, non-productive cough since yesterday. He also developed chills, fatigue, generalized body aches and sore throat. He did not take his temperature. He tested negative for Covid-19. He's taken 500 mg Tylenol today to manage his symptoms.  He is also requesting a refill for 10 mg amlodipine daily PO, 40-25 mg olmesartan-hydrochlorothiazide daily PO, 25 mg metoprolol succinate daily PO, Flonase nasal spray, and Astelin nasal spray.    Past Medical History:  Diagnosis Date   Hyperlipidemia    on meds   Hypertension    on meds   Sleep apnea    uses CPAP    Past Surgical History:  Procedure Laterality Date   NO PAST SURGERIES      Family History  Problem Relation Age of Onset   Crohn's disease Other    Colon cancer Neg Hx    Esophageal cancer Neg Hx    Colon polyps Neg Hx    Stomach cancer Neg Hx    Rectal cancer Neg Hx     Social History   Socioeconomic History   Marital status: Married    Spouse name: Not on file   Number of children: 2   Years of education: Not on file   Highest education level: Not on file  Occupational History   Occupation: [employer]  Tobacco Use   Smoking status: Never   Smokeless tobacco: Never  Vaping Use   Vaping Use: Never used  Substance and  Sexual Activity   Alcohol use: Not Currently    Alcohol/week: 2.0 standard drinks of alcohol    Types: 2 Standard drinks or equivalent per week    Comment: rarely   Drug use: No   Sexual activity: Not Currently  Other Topics Concern   Not on file  Social History Narrative   Lives at home with spouse and children   Right handed   Caffeine: about 8 cups/day of coffee   Social Determinants of Health   Financial Resource Strain: Low Risk  (06/20/2019)   Overall Financial Resource Strain (CARDIA)    Difficulty of Paying Living Expenses: Not hard at all  Food Insecurity: No Food Insecurity (06/20/2019)   Hunger Vital Sign    Worried About Running Out of Food in the Last Year: Never true    Ran Out of Food in the Last Year: Never true  Transportation Needs: No Transportation Needs (06/20/2019)   PRAPARE - Administrator, Civil Service (Medical): No    Lack of Transportation (Non-Medical): No  Physical Activity: Sufficiently Active (06/20/2019)   Exercise Vital Sign    Days of Exercise per Week: 5 days    Minutes of Exercise per Session: 50 min  Stress: Stress Concern Present (06/20/2019)   [employer] of Occupational Health -  Occupational Stress Questionnaire    Feeling of Stress : To some extent  Social Connections: Unknown (06/20/2019)   Social Connection and Isolation Panel [NHANES]    Frequency of Communication with Friends and Family: Three times a week    Frequency of Social Gatherings with Friends and Family: Twice a week    Attends Religious Services: Patient declined    Database administrator or Organizations: Patient declined    Attends Banker Meetings: Patient declined    Marital Status: Patient declined  Intimate Partner Violence: Not At Risk (06/20/2019)   Humiliation, Afraid, Rape, and Kick questionnaire    Fear of Current or Ex-Partner: No    Emotionally Abused: No    Physically Abused: No    Sexually Abused: No    Outpatient  Medications Prior to Visit  Medication Sig Dispense Refill   Blood Pressure Monitoring (BLOOD PRESSURE CUFF) MISC 1 each by Does not apply route daily. 1 each 0   cetirizine (ZYRTEC) 10 MG tablet Take 1 tablet (10 mg total) by mouth daily. 30 tablet 11   fluocinonide ointment (LIDEX) 0.05 % Apply 1 application topically 2 (two) times daily. 30 g 0   olopatadine (PATANOL) 0.1 % ophthalmic solution Place 1 drop into both eyes 2 (two) times daily. 5 mL 12   RESTASIS 0.05 % ophthalmic emulsion      amLODipine (NORVASC) 10 MG tablet Take 1 tablet (10 mg total) by mouth daily. 90 tablet 3   azelastine (ASTELIN) 0.1 % nasal spray Place 1 spray into both nostrils 2 (two) times daily. Use in each nostril as directed 30 mL 12   fluticasone (FLONASE) 50 MCG/ACT nasal spray Place 2 sprays into both nostrils daily. 16 g 6   metoprolol succinate (TOPROL-XL) 25 MG 24 hr tablet TAKE 1 TABLET(25 MG) BY MOUTH DAILY 90 tablet 0   olmesartan-hydrochlorothiazide (BENICAR HCT) 40-25 MG tablet Take 1 tablet by mouth daily. 90 tablet 1   hydrOXYzine (ATARAX) 10 MG tablet TAKE 1 TABLET(10 MG) BY MOUTH THREE TIMES DAILY AS NEEDED (Patient not taking: Reported on 04/05/2023) 30 tablet 4   rosuvastatin (CRESTOR) 20 MG tablet Take 1 tablet (20 mg total) by mouth daily. (Patient not taking: Reported on 04/05/2023) 90 tablet 1   Facility-Administered Medications Prior to Visit  Medication Dose Route Frequency Provider Last Rate Last Admin   0.9 %  sodium chloride infusion  500 mL Intravenous Once Armbruster, Willaim Rayas, MD        No Known Allergies  Review of Systems  Constitutional:  Positive for chills. Negative for fever and malaise/fatigue.  HENT:  Positive for sore throat. Negative for congestion.   Eyes:  Negative for blurred vision.  Respiratory:  Positive for cough. Negative for shortness of breath.   Cardiovascular:  Negative for chest pain, palpitations and leg swelling.  Gastrointestinal:  Negative for abdominal  pain, blood in stool and nausea.  Genitourinary:  Negative for dysuria and frequency.  Musculoskeletal:  Negative for falls.       (+)generalized body aches   Skin:  Negative for rash.  Neurological:  Negative for dizziness, loss of consciousness and headaches.  Endo/Heme/Allergies:  Negative for environmental allergies.  Psychiatric/Behavioral:  Negative for depression. The patient is not nervous/anxious.        Objective:    Physical Exam Vitals and nursing note reviewed.  Constitutional:      General: He is not in acute distress.    Appearance: Normal appearance. He is  not ill-appearing.  HENT:     Head: Normocephalic and atraumatic.     Right Ear: External ear normal.     Left Ear: External ear normal.  Eyes:     Extraocular Movements: Extraocular movements intact.     Pupils: Pupils are equal, round, and reactive to light.  Cardiovascular:     Rate and Rhythm: Normal rate and regular rhythm.     Heart sounds: Normal heart sounds. No murmur heard.    No gallop.  Pulmonary:     Effort: Pulmonary effort is normal. No respiratory distress.     Breath sounds: Normal breath sounds. No wheezing or rales.     Comments: Tested negative for Flu and Strep Musculoskeletal:        General: Normal range of motion.  Skin:    General: Skin is warm and dry.  Neurological:     General: No focal deficit present.     Mental Status: He is alert and oriented to person, place, and time.  Psychiatric:        Mood and Affect: Mood normal.        Judgment: Judgment normal.   Flu and strep neg   BP (!) 140/88 (BP Location: Left Arm, Patient Position: Sitting, Cuff Size: Large)   Pulse (!) 112   Temp 99.7 F (37.6 C) (Oral)   Resp 18   Ht 5\' 5"  (1.651 m)   Wt 215 lb (97.5 kg)   SpO2 95%   BMI 35.78 kg/m  Wt Readings from Last 3 Encounters:  04/05/23 215 lb (97.5 kg)  09/06/22 214 lb 9.6 oz (97.3 kg)  05/08/22 218 lb (98.9 kg)       Assessment & Plan:  Flu-like symptoms -      POC Influenza A&B(BINAX/QUICKVUE)  Sore throat -     POCT rapid strep A  Essential hypertension -     amLODIPine Besylate; Take 1 tablet (10 mg total) by mouth daily.  Dispense: 90 tablet; Refill: 3 -     Metoprolol Succinate ER; TAKE 1 TABLET(25 MG) BY MOUTH DAILY  Dispense: 90 tablet; Refill: 1 -     Olmesartan Medoxomil-HCTZ; Take 1 tablet by mouth daily.  Dispense: 90 tablet; Refill: 1  Acute cough -     Promethazine-DM; Take 5 mLs by mouth 4 (four) times daily as needed.  Dispense: 118 mL; Refill: 0  Seasonal allergies -     Azelastine HCl; Place 1 spray into both nostrils 2 (two) times daily. Use in each nostril as directed  Dispense: 30 mL; Refill: 12  Acute non-recurrent pansinusitis -     Amoxicillin-Pot Clavulanate; Take 1 tablet by mouth 2 (two) times daily.  Dispense: 20 tablet; Refill: 0  Other orders -     Fluticasone Propionate; Place 2 sprays into both nostrils daily.  Dispense: 16 g; Refill: 6    I, Donato Schultz, DO, personally preformed the services described in this documentation.  All medical record entries made by the scribe were at my direction and in my presence.  I have reviewed the chart and discharge instructions (if applicable) and agree that the record reflects my personal performance and is accurate and complete. 04/05/2023   I,Eric Obrien,acting as a scribe for Donato Schultz, DO.,have documented all relevant documentation on the behalf of Donato Schultz, DO,as directed by  Donato Schultz, DO while in the presence of Donato Schultz, DO.   Lelon Perla  Chase, DO

## 2023-05-31 ENCOUNTER — Other Ambulatory Visit: Payer: Self-pay | Admitting: Family Medicine

## 2024-04-14 ENCOUNTER — Encounter: Payer: Self-pay | Admitting: Family Medicine

## 2024-04-14 ENCOUNTER — Ambulatory Visit (INDEPENDENT_AMBULATORY_CARE_PROVIDER_SITE_OTHER): Payer: PRIVATE HEALTH INSURANCE | Admitting: Family Medicine

## 2024-04-14 ENCOUNTER — Ambulatory Visit: Payer: Self-pay

## 2024-04-14 VITALS — BP 128/84 | HR 110 | Temp 98.0°F | Resp 16 | Ht 65.0 in | Wt 223.0 lb

## 2024-04-14 DIAGNOSIS — J189 Pneumonia, unspecified organism: Secondary | ICD-10-CM | POA: Diagnosis not present

## 2024-04-14 MED ORDER — PROMETHAZINE-DM 6.25-15 MG/5ML PO SYRP: 5.0000 mL | ORAL_SOLUTION | Freq: Four times a day (QID) | ORAL | 0 refills | Status: DC | PRN

## 2024-04-14 MED ORDER — DOXYCYCLINE HYCLATE 100 MG PO TABS: 100.0000 mg | ORAL_TABLET | Freq: Two times a day (BID) | ORAL | 0 refills | Status: AC

## 2024-04-14 MED ORDER — FLUTICASONE PROPIONATE 50 MCG/ACT NA SUSP: 2.0000 | Freq: Every day | NASAL | 6 refills | Status: AC

## 2024-04-14 NOTE — Patient Instructions (Signed)
 Continue to push fluids, practice good hand hygiene, and cover your mouth if you cough. ? ?If you start having fevers, shaking or shortness of breath, seek immediate care. ? ?OK to take Tylenol 1000 mg (2 extra strength tabs) or 975 mg (3 regular strength tabs) every 6 hours as needed. ? ?Let us know if you need anything. ?

## 2024-04-14 NOTE — Progress Notes (Signed)
 Chief Complaint  Patient presents with   Cough    Cough     Eric Obrien here for URI complaints.  Duration: 1 day  Associated symptoms: sinus congestion, sinus pain, rhinorrhea, sore throat, and coughing, central chest pain when he coughs Denies: itchy watery eyes, ear pain, ear drainage, wheezing, shortness of breath, myalgia, and fevers Treatment to date: Claritin, INCS Sick contacts: No  Past Medical History:  Diagnosis Date   Hyperlipidemia    on meds   Hypertension    on meds   Sleep apnea    uses CPAP    Objective BP 128/84 (BP Location: Left Arm, Patient Position: Sitting)   Pulse (!) 110   Temp 98 F (36.7 C) (Oral)   Resp 16   Ht 5\' 5"  (1.651 m)   Wt 223 lb (101.2 kg)   SpO2 99%   BMI 37.11 kg/m  General: Awake, alert, appears stated age HEENT: AT, [place], ears patent b/l and TM's neg, nares patent w clear discharge, pharynx pink and without exudates, MMM, no sinus ttp Neck: No masses or asymmetry Heart: Reg rhythm, tachycardic, no LE edema Lungs: decreased breath sounds in LL lung field- clear otherwise, no accessory muscle use MSK: No calf pain b/l Psych: Age appropriate judgment and insight, normal mood and affect  Community acquired pneumonia of left lower lobe of lung - Plan: promethazine -dextromethorphan (PROMETHAZINE -DM) 6.25-15 MG/5ML syrup, doxycycline  (VIBRA -TABS) 100 MG tablet  7 d of doxy tx'ing for PNA. Syrup as above. Warned about drowsiness. Continue to push fluids, practice good hand hygiene, cover mouth when coughing. Letter for work provided. F/u prn. If starting to experience fevers, shaking, or shortness of breath, seek immediate care. Pt voiced understanding and agreement to the plan.  Shellie Dials Roosevelt, DO 04/14/24 2:07 PM

## 2024-04-14 NOTE — Telephone Encounter (Signed)
 Copied from CRM [id number]. Topic: Clinical - Red Word Triage >> Apr 14, 2024  9:44 AM Jayson Michael wrote: [facility] that prompted transfer to Nurse Triage: chest tenderness/pain, persistent non productive cough  Patient is wanting an acute afternoon appointment. Patient denies fever and shortness of breath, symptoms presented 24 hours ago. But his cough has made his chest hurt.   Chief Complaint: Cough Symptoms: Cough, chest pain, sore throat  Frequency: Intermittent  Pertinent Negatives: Patient denies shortness of breath, fever  Disposition: [] ED /[] Urgent Care (no appt availability in office) / [x] Appointment(In office/virtual)/ []  [facility]/ [] Home Care/ [] Refused Recommended Disposition /[] [facility]/ []  Follow-up with PCP Additional Notes: Patient reports he developed a non-productive cough yesterday. He states that with his cough he is having some associated sore throat and chest pain. He states his chest pain is only present when coughing. Patient denies any fevers or shortness of breath. Appointment made for the patient today for evaluation.    Reason for Disposition  [1] Continuous (nonstop) coughing interferes with work or school AND [2] no improvement using cough treatment per Care Advice  Answer Assessment - Initial Assessment Questions 1. ONSET: "When did the cough begin?"      Yesterday  2. SEVERITY: "How bad is the cough today?"      Moderate  3. SPUTUM: "Describe the color of your sputum" (none, dry cough; clear, white, yellow, green)     No 4. HEMOPTYSIS: "Are you coughing up any blood?" If so ask: "How much?" (flecks, streaks, tablespoons, etc.)     No 5. DIFFICULTY BREATHING: "Are you having difficulty breathing?" If Yes, ask: "How bad is it?" (e.g., mild, moderate, severe)    - MILD: No SOB at rest, mild SOB with walking, speaks normally in sentences, can lie down, no retractions, pulse < 100.    - MODERATE: SOB at rest, SOB with minimal exertion  and prefers to sit, cannot lie down flat, speaks in phrases, mild retractions, audible wheezing, pulse 100-120.    - SEVERE: Very SOB at rest, speaks in single words, struggling to breathe, sitting hunched forward, retractions, pulse > 120      No 6. FEVER: "Do you have a fever?" If Yes, ask: "What is your temperature, how was it measured, and when did it start?"     No 7. CARDIAC HISTORY: "Do you have any history of heart disease?" (e.g., heart attack, congestive heart failure)      Yes 8. LUNG HISTORY: "Do you have any history of lung disease?"  (e.g., pulmonary embolus, asthma, emphysema)     No 9. PE RISK FACTORS: "Do you have a history of blood clots?" (or: recent major surgery, recent prolonged travel, bedridden)     No 10. OTHER SYMPTOMS: "Do you have any other symptoms?" (e.g., runny nose, wheezing, chest pain)       Chest pain, sore throat  12. TRAVEL: "Have you traveled out of the country in the last month?" (e.g., travel history, exposures)       No  Protocols used: Cough - Acute Non-Productive-A-AH

## 2024-05-16 ENCOUNTER — Other Ambulatory Visit: Payer: Self-pay | Admitting: Family Medicine

## 2024-05-16 DIAGNOSIS — I10 Essential (primary) hypertension: Secondary | ICD-10-CM

## 2024-06-10 ENCOUNTER — Encounter: Payer: Self-pay | Admitting: Medical

## 2024-06-10 ENCOUNTER — Ambulatory Visit (INDEPENDENT_AMBULATORY_CARE_PROVIDER_SITE_OTHER): Payer: PRIVATE HEALTH INSURANCE | Admitting: Medical

## 2024-06-10 VITALS — BP 130/88 | HR 80 | Temp 98.2°F | Resp 18 | Ht 65.0 in | Wt 226.0 lb

## 2024-06-10 DIAGNOSIS — Z125 Encounter for screening for malignant neoplasm of prostate: Secondary | ICD-10-CM | POA: Diagnosis not present

## 2024-06-10 DIAGNOSIS — Z13220 Encounter for screening for lipoid disorders: Secondary | ICD-10-CM

## 2024-06-10 DIAGNOSIS — Z0000 Encounter for general adult medical examination without abnormal findings: Secondary | ICD-10-CM | POA: Diagnosis not present

## 2024-06-10 DIAGNOSIS — Z113 Encounter for screening for infections with a predominantly sexual mode of transmission: Secondary | ICD-10-CM

## 2024-06-10 DIAGNOSIS — R4189 Other symptoms and signs involving cognitive functions and awareness: Secondary | ICD-10-CM | POA: Diagnosis not present

## 2024-06-10 DIAGNOSIS — D1722 Benign lipomatous neoplasm of skin and subcutaneous tissue of left arm: Secondary | ICD-10-CM

## 2024-06-10 DIAGNOSIS — I10 Essential (primary) hypertension: Secondary | ICD-10-CM | POA: Diagnosis not present

## 2024-06-10 LAB — CBC WITH DIFFERENTIAL/PLATELET
Basophils Absolute: 0.1 K/uL (ref 0.0–0.1)
Basophils Relative: 0.7 % (ref 0.0–3.0)
Eosinophils Absolute: 0.1 K/uL (ref 0.0–0.7)
Eosinophils Relative: 0.8 % (ref 0.0–5.0)
HCT: 41 % (ref 39.0–52.0)
Hemoglobin: 13.3 g/dL (ref 13.0–17.0)
Lymphocytes Relative: 43.1 % (ref 12.0–46.0)
Lymphs Abs: 3.2 K/uL (ref 0.7–4.0)
MCHC: 32.5 g/dL (ref 30.0–36.0)
MCV: 85.6 fl (ref 78.0–100.0)
Monocytes Absolute: 0.5 K/uL (ref 0.1–1.0)
Monocytes Relative: 6.2 % (ref 3.0–12.0)
Neutro Abs: 3.6 K/uL (ref 1.4–7.7)
Neutrophils Relative %: 49.2 % (ref 43.0–77.0)
Platelets: 247 K/uL (ref 150.0–400.0)
RBC: 4.79 Mil/uL (ref 4.22–5.81)
RDW: 13.4 % (ref 11.5–15.5)
WBC: 7.3 K/uL (ref 4.0–10.5)

## 2024-06-10 LAB — COMPREHENSIVE METABOLIC PANEL WITH GFR
ALT: 29 U/L (ref 0–53)
AST: 27 U/L (ref 0–37)
Albumin: 4.5 g/dL (ref 3.5–5.2)
Alkaline Phosphatase: 65 U/L (ref 39–117)
BUN: 21 mg/dL (ref 6–23)
CO2: 34 meq/L — ABNORMAL HIGH (ref 19–32)
Calcium: 9.7 mg/dL (ref 8.4–10.5)
Chloride: 101 meq/L (ref 96–112)
Creatinine, Ser: 1.27 mg/dL (ref 0.40–1.50)
GFR: 62.87 mL/min (ref >60.00)
Glucose, Bld: 98 mg/dL (ref 70–99)
Potassium: 4.3 meq/L (ref 3.5–5.1)
Sodium: 140 meq/L (ref 135–145)
Total Bilirubin: 0.6 mg/dL (ref 0.2–1.2)
Total Protein: 7.5 g/dL (ref 6.0–8.3)

## 2024-06-10 LAB — PSA: PSA: 1.31 ng/mL (ref 0.10–4.00)

## 2024-06-10 LAB — LIPID PANEL
Cholesterol: 297 mg/dL — ABNORMAL HIGH (ref 0–200)
HDL: 48.3 mg/dL (ref >39.00)
LDL Cholesterol: 225 mg/dL — ABNORMAL HIGH (ref 0–99)
NonHDL: 248.42
Total CHOL/HDL Ratio: 6
Triglycerides: 117 mg/dL (ref 0.0–149.0)
VLDL: 23.4 mg/dL (ref 0.0–40.0)

## 2024-06-10 MED ORDER — BUPROPION HCL ER (XL) 150 MG PO TB24
150.0000 mg | ORAL_TABLET | Freq: Every day | ORAL | 0 refills | Status: AC
Start: 2024-06-10 — End: ?

## 2024-06-10 NOTE — Patient Instructions (Addendum)
 For you wellness exam today I have ordered cbc, cmp, psa hiv and lipid panel  Vaccine given today shingrix. Tdap declined today  Recommend exercise and healthy diet.  We will let you know lab results as they come in.  Follow up date appointment will be determined after lab review.    Attention/concentration  difficulty Possible ADD effe cing memory, affecting his studies. Wellbutrin  XL considered safe for ADD with hypertension. - Prescribe Wellbutrin  XL 150 mg daily. - Follow up in 10 days to assess response. - Monitor blood pressure and pulse during follow-up.  Hypertension Hypertension managed with amlodipine  and metoprolol . Monitor for Wellbutrin  and metoprolol  interaction affecting pulse rate. - Continue amlodipine  and metoprolol . - Monitor blood pressure and pulse during follow-up.  Lipoma Left forearm lump likely a lipoma, unchanged in size, previously referred to surgery. - Refer to general surgeon for evaluation. - Advise to contact [facility] if not contacted within a week. Sent to [facility] [address], [place], [place] [address] Phone: [phone]  Follow up 06-24-24 or sooner if needed  Preventive Care 57-30 Years Old, Male Preventive care refers to lifestyle choices and visits with your health care provider that can promote health and wellness. Preventive care visits are also called wellness exams. What can I expect for my preventive care visit? Counseling During your preventive care visit, your health care provider may ask about your: Medical history, including: Past medical problems. Family medical history. Current health, including: Emotional well-being. Home life and relationship well-being. Sexual activity. Lifestyle, including: Alcohol, nicotine or tobacco, and drug use. Access to firearms. Diet, exercise, and sleep habits. Safety issues such as seatbelt and bike helmet use. Sunscreen use. Work and work Astronomer. Physical  exam Your health care provider will check your: Height and weight. These may be used to calculate your BMI (body mass index). BMI is a measurement that tells if you are at a healthy weight. Waist circumference. This measures the distance around your waistline. This measurement also tells if you are at a healthy weight and may help predict your risk of certain diseases, such as type 2 diabetes and high blood pressure. Heart rate and blood pressure. Body temperature. Skin for abnormal spots. What immunizations do I need?  Vaccines are usually given at various ages, according to a schedule. Your health care provider will recommend vaccines for you based on your age, medical history, and lifestyle or other factors, such as travel or where you work. What tests do I need? Screening Your health care provider may recommend screening tests for certain conditions. This may include: Lipid and cholesterol levels. Diabetes screening. This is done by checking your blood sugar (glucose) after you have not eaten for a while (fasting). Hepatitis B test. Hepatitis C test. HIV (human immunodeficiency virus) test. STI (sexually transmitted infection) testing, if you are at risk. Lung cancer screening. Prostate cancer screening. Colorectal cancer screening. Talk with your health care provider about your test results, treatment options, and if necessary, the need for more tests. Follow these instructions at home: Eating and drinking  Eat a diet that includes fresh fruits and vegetables, whole grains, lean protein, and low-fat dairy products. Take vitamin and mineral supplements as recommended by your health care provider. Do not drink alcohol if your health care provider tells you not to drink. If you drink alcohol: Limit how much you have to 0-2 drinks a day. Know how much alcohol is in your drink. In the [place] one drink equals  one 12 oz bottle of beer (355 mL), one 5 oz glass of wine (148 mL), or one 1  oz glass of hard liquor (44 mL). Lifestyle Brush your teeth every morning and night with fluoride toothpaste. Floss one time each day. Exercise for at least 30 minutes 5 or more days each week. Do not use any products that contain nicotine or tobacco. These products include cigarettes, chewing tobacco, and vaping devices, such as e-cigarettes. If you need help quitting, ask your health care provider. Do not use drugs. If you are sexually active, practice safe sex. Use a condom or other form of protection to prevent STIs. Take aspirin only as told by your health care provider. Make sure that you understand how much to take and what form to take. Work with your health care provider to find out whether it is safe and beneficial for you to take aspirin daily. Find healthy ways to manage stress, such as: Meditation, yoga, or listening to music. Journaling. Talking to a trusted person. Spending time with friends and family. Minimize exposure to UV radiation to reduce your risk of skin cancer. Safety Always wear your seat belt while driving or riding in a vehicle. Do not drive: If you have been drinking alcohol. Do not ride with someone who has been drinking. When you are tired or distracted. While texting. If you have been using any mind-altering substances or drugs. Wear a helmet and other protective equipment during sports activities. If you have firearms in your house, make sure you follow all gun safety procedures. What's next? Go to your health care provider once a year for an annual wellness visit. Ask your health care provider how often you should have your eyes and teeth checked. Stay up to date on all vaccines. This information is not intended to replace advice given to you by your health care provider. Make sure you discuss any questions you have with your health care provider. Document Revised: 05/11/2021 Document Reviewed: 05/11/2021 Elsevier Patient Education  2024 [employer].

## 2024-06-10 NOTE — Progress Notes (Signed)
 Subjective:    Patient ID: Eric Obrien, male    DOB: [birth year], 58 y.o.   MRN: [id number]  HPI  He is a 58 year old male who presents for a wellness exam, discuss htn, concentration difficulty  evaluation of a left forearm lump.  Works as Child psychotherapist, exercise regularly, nonsmoker,  alcohol on weekends. and rare caffein beverage.  up to date on colonoscopy. Will get psa today.  Will get  shingrix vaccine but decline tetanus update today.  He has a lump on his left forearm that has been present for approximately two years, coinciding with a TB test he received around that time. The lump is not painful but causes discomfort and, according to the patient, has remained the same size since its appearance.  He is currently pursuing a master's degree in substance abuse counseling and is experiencing difficulty with concentration and information retrieval, which he did not experience in his youth. He is three months into a twelve-month program and finds it challenging to keep up with the coursework.  Htn. he is on  amlodipine  10 mg daily and metoprolol  25 mg daily        Review of Systems  Constitutional:  Negative for chills and fatigue.  HENT:  Negative for congestion and ear discharge.   Respiratory:  Negative for chest tightness, shortness of breath and wheezing.   Cardiovascular:  Negative for chest pain and palpitations.  Gastrointestinal:  Negative for abdominal pain, diarrhea, nausea and vomiting.  Genitourinary:  Negative for dysuria, frequency, penile pain and urgency.  Musculoskeletal:  Negative for back pain.  Neurological:  Negative for dizziness, weakness and light-headedness.  Hematological:  Negative for adenopathy.  Psychiatric/Behavioral:  Positive for decreased concentration. Negative for agitation, behavioral problems, hallucinations and sleep disturbance. The patient is not nervous/anxious.     Past Medical History:  Diagnosis Date   Hyperlipidemia    on  meds   Hypertension    on meds   Sleep apnea    uses CPAP     Social History   Socioeconomic History   Marital status: Married    Spouse name: Not on file   Number of children: 2   Years of education: Not on file   Highest education level: Not on file  Occupational History   Occupation: [employer]  Tobacco Use   Smoking status: Never   Smokeless tobacco: Never  Vaping Use   Vaping status: Never Used  Substance and Sexual Activity   Alcohol use: Not Currently    Alcohol/week: 2.0 standard drinks of alcohol    Types: 2 Standard drinks or equivalent per week    Comment: rarely   Drug use: No   Sexual activity: Not Currently  Other Topics Concern   Not on file  Social History Narrative   Lives at home with spouse and children   Right handed   Caffeine: about 8 cups/day of coffee   Social Drivers of Corporate investment banker Strain: Low Risk  (06/20/2019)   Overall Financial Resource Strain (CARDIA)    Difficulty of Paying Living Expenses: Not hard at all  Food Insecurity: No Food Insecurity (06/20/2019)   Hunger Vital Sign    Worried About Running Out of Food in the Last Year: Never true    Ran Out of Food in the Last Year: Never true  Transportation Needs: No Transportation Needs (06/20/2019)   PRAPARE - Transportation    Lack of Transportation (Medical): No    Lack of  Transportation (Non-Medical): No  Physical Activity: Sufficiently Active (06/20/2019)   Exercise Vital Sign    Days of Exercise per Week: 5 days    Minutes of Exercise per Session: 50 min  Stress: Stress Concern Present (06/20/2019)   [employer] of Occupational Health - Occupational Stress Questionnaire    Feeling of Stress : To some extent  Social Connections: Unknown (06/20/2019)   Social Connection and Isolation Panel    Frequency of Communication with Friends and Family: Three times a week    Frequency of Social Gatherings with Friends and Family: Twice a week    Attends Religious Services:  Patient declined    Database administrator or Organizations: Patient declined    Attends Banker Meetings: Patient declined    Marital Status: Patient declined  Intimate Partner Violence: Not At Risk (06/20/2019)   Humiliation, Afraid, Rape, and Kick questionnaire    Fear of Current or Ex-Partner: No    Emotionally Abused: No    Physically Abused: No    Sexually Abused: No    Past Surgical History:  Procedure Laterality Date   NO PAST SURGERIES      Family History  Problem Relation Age of Onset   Crohn's disease Other    Colon cancer Neg Hx    Esophageal cancer Neg Hx    Colon polyps Neg Hx    Stomach cancer Neg Hx    Rectal cancer Neg Hx     No Known Allergies  Current Outpatient Medications on File Prior to Visit  Medication Sig Dispense Refill   amLODipine  (NORVASC ) 10 MG tablet TAKE 1 TABLET(10 MG) BY MOUTH DAILY 90 tablet 3   azelastine  (ASTELIN ) 0.1 % nasal spray Place 1 spray into both nostrils 2 (two) times daily. Use in each nostril as directed 30 mL 12   Blood Pressure Monitoring (BLOOD PRESSURE CUFF) MISC 1 each by Does not apply route daily. 1 each 0   cetirizine  (ZYRTEC ) 10 MG tablet Take 1 tablet (10 mg total) by mouth daily. 30 tablet 11   fluocinonide  ointment (LIDEX ) 0.05 % Apply 1 application topically 2 (two) times daily. 30 g 0   fluticasone  (FLONASE ) 50 MCG/ACT nasal spray Place 2 sprays into both nostrils daily. 16 g 6   metoprolol  succinate (TOPROL -XL) 25 MG 24 hr tablet TAKE 1 TABLET(25 MG) BY MOUTH DAILY 90 tablet 1   olmesartan -hydrochlorothiazide  (BENICAR  HCT) 40-25 MG tablet Take 1 tablet by mouth daily. 90 tablet 1   olopatadine  (PATANOL) 0.1 % ophthalmic solution Place 1 drop into both eyes 2 (two) times daily. 5 mL 12   promethazine -dextromethorphan (PROMETHAZINE -DM) 6.25-15 MG/5ML syrup Take 5 mLs by mouth 4 (four) times daily as needed. 118 mL 0   RESTASIS 0.05 % ophthalmic emulsion      rosuvastatin  (CRESTOR ) 20 MG tablet Take 1  tablet (20 mg total) by mouth daily. (Patient not taking: Reported on 04/05/2023) 90 tablet 1   Current Facility-Administered Medications on File Prior to Visit  Medication Dose Route Frequency Provider Last Rate Last Admin   0.9 %  sodium chloride  infusion  500 mL Intravenous Once Armbruster, Elspeth SQUIBB, MD        BP 130/88   Pulse 80   Temp 98.2 F (36.8 C)   Resp 18   Ht 5' 5 (1.651 m)   Wt 226 lb (102.5 kg)   SpO2 94%   BMI 37.61 kg/m        Objective:   Physical Exam  General Mental Status- Alert. General Appearance- Not in acute distress.   Skin General: Color- Normal Color. Moisture- Normal Moisture.  Neck Carotid Arteries- Normal color. Moisture- Normal Moisture. No carotid bruits. No JVD.  Chest and Lung Exam Auscultation: Breath Sounds:-Normal.  Cardiovascular Auscultation:Rythm- Regular. Murmurs & Other Heart Sounds:Auscultation of the heart reveals- No Murmurs.  Abdomen Inspection:-Inspeection Normal. Palpation/Percussion:Note:No mass. Palpation and Percussion of the abdomen reveal- Non Tender, Non Distended + BS, no rebound or guarding.   Neurologic Cranial Nerve exam:- CN III-XII intact(No nystagmus), symmetric smile. Drift Test:- No drift. Romberg Exam:- Negative.  Heal to Toe Gait exam:-Normal. Finger to Nose:- Normal/Intact Strength:- 5/5 equal and symmetric strength both upper and lower extremities.   Left upper ext- moderate mid left forearm lipoma.     Assessment & Plan:    Patient Instructions   For you wellness exam today I have ordered cbc, cmp, psa hiv and lipid panel  Vaccine given today shingrix. Tdap declined today  Recommend exercise and healthy diet.  We will let you know lab results as they come in.  Follow up date appointment will be determined after lab review.    Attention difficulty Possible ADD effe cing memory, affecting his studies. Wellbutrin  XL considered safe for ADD with hypertension. - Prescribe Wellbutrin   XL 150 mg daily. - Follow up in 10 days to assess response. - Monitor blood pressure and pulse during follow-up.  Hypertension Hypertension managed with amlodipine  and metoprolol . Monitor for Wellbutrin  and metoprolol  interaction affecting pulse rate. - Continue amlodipine  and metoprolol . - Monitor blood pressure and pulse during follow-up.  Lipoma Left forearm lump likely a lipoma, unchanged in size, previously referred to surgery. - Refer to general surgeon for evaluation. - Advise to contact [facility] if not contacted within a week. Sent to [facility] [address], [place], [place] [address] Phone: [phone]  Follow up 06-24-24    [id number] charge as did addrss attention, htn and lipoma

## 2024-06-11 LAB — HIV ANTIBODY (ROUTINE TESTING W REFLEX): HIV 1&2 Ab, 4th Generation: NONREACTIVE

## 2024-06-12 ENCOUNTER — Ambulatory Visit (INDEPENDENT_AMBULATORY_CARE_PROVIDER_SITE_OTHER): Payer: PRIVATE HEALTH INSURANCE | Admitting: Family

## 2024-06-12 ENCOUNTER — Encounter: Payer: Self-pay | Admitting: Family

## 2024-06-12 ENCOUNTER — Telehealth: Payer: Self-pay | Admitting: Medical

## 2024-06-12 ENCOUNTER — Encounter: Payer: Self-pay | Admitting: Medical

## 2024-06-12 VITALS — BP 122/70 | HR 77 | Ht 65.0 in | Wt 226.0 lb

## 2024-06-12 DIAGNOSIS — R109 Unspecified abdominal pain: Secondary | ICD-10-CM

## 2024-06-12 MED ORDER — AMOXICILLIN-POT CLAVULANATE 875-125 MG PO TABS: 1.0000 | ORAL_TABLET | Freq: Two times a day (BID) | ORAL | 0 refills | Status: AC

## 2024-06-12 NOTE — Progress Notes (Signed)
 Eric Obrien is a 58 y.o. male with the following history as recorded in EpicCare:  Patient Active Problem List   Diagnosis Date Noted   Seasonal allergies 02/28/2022   Uncontrolled hypertension 09/09/2021   Itch 09/09/2021   Hyperlipidemia 11/04/2015   Obesity (BMI 30-39.9) 11/04/2015   Hypertensive heart disease without CHF     Current Outpatient Medications  Medication Sig Dispense Refill   amLODipine  (NORVASC ) 10 MG tablet TAKE 1 TABLET(10 MG) BY MOUTH DAILY 90 tablet 3   amoxicillin -clavulanate (AUGMENTIN ) 875-125 MG tablet Take 1 tablet by mouth 2 (two) times daily for 7 days. 14 tablet 0   azelastine  (ASTELIN ) 0.1 % nasal spray Place 1 spray into both nostrils 2 (two) times daily. Use in each nostril as directed 30 mL 12   Blood Pressure Monitoring (BLOOD PRESSURE CUFF) MISC 1 each by Does not apply route daily. 1 each 0   buPROPion  (WELLBUTRIN  XL) 150 MG 24 hr tablet Take 1 tablet (150 mg total) by mouth daily. 30 tablet 0   cetirizine  (ZYRTEC ) 10 MG tablet Take 1 tablet (10 mg total) by mouth daily. 30 tablet 11   fluocinonide  ointment (LIDEX ) 0.05 % Apply 1 application topically 2 (two) times daily. 30 g 0   fluticasone  (FLONASE ) 50 MCG/ACT nasal spray Place 2 sprays into both nostrils daily. 16 g 6   metoprolol  succinate (TOPROL -XL) 25 MG 24 hr tablet TAKE 1 TABLET(25 MG) BY MOUTH DAILY 90 tablet 1   olmesartan -hydrochlorothiazide  (BENICAR  HCT) 40-25 MG tablet Take 1 tablet by mouth daily. 90 tablet 1   olopatadine  (PATANOL) 0.1 % ophthalmic solution Place 1 drop into both eyes 2 (two) times daily. 5 mL 12   RESTASIS 0.05 % ophthalmic emulsion      No current facility-administered medications for this visit.    Allergies: Patient has no known allergies.  Past Medical History:  Diagnosis Date   Hyperlipidemia    on meds   Hypertension    on meds   Sleep apnea    uses CPAP    Past Surgical History:  Procedure Laterality Date   NO PAST SURGERIES      Family History   Problem Relation Age of Onset   Crohn's disease Other    Colon cancer Neg Hx    Esophageal cancer Neg Hx    Colon polyps Neg Hx    Stomach cancer Neg Hx    Rectal cancer Neg Hx     Social History   Tobacco Use   Smoking status: Never   Smokeless tobacco: Never  Substance Use Topics   Alcohol use: Not Currently    Alcohol/week: 2.0 standard drinks of alcohol    Types: 2 Standard drinks or equivalent per week    Comment: rarely    Subjective:   Started with abdominal pain yesterday- was noted to be very constipated and did get relief with use of prune juice; similar episode of abdominal pain last week- notes he was constipated then as well;  Colonoscopy done in 2022- due again in 7 years; no rectal bleeding; no vomiting;     Objective:  Vitals:   06/12/24 1430  BP: 122/70  Pulse: 77  SpO2: 98%  Weight: 226 lb (102.5 kg)  Height: 5' 5 (1.651 m)    General: Well developed, well nourished, in no acute distress  Skin : Warm and dry.  Head: Normocephalic and atraumatic  Lungs: Respirations unlabored; clear to auscultation bilaterally without wheeze, rales, rhonchi  CVS exam: normal rate  and regular rhythm.  Abdomen: Soft; nontender; nondistended; normoactive bowel sounds; no masses or hepatosplenomegaly  Neurologic: Alert and oriented; speech intact; face symmetrical; moves all extremities well; CNII-XII intact without focal deficit   Assessment:  1. Abdominal pain, unspecified abdominal location     Plan:  ? Diverticulitis; patient had CBC, CMP done 2 days ago- will hold on repeating labs again today; will update abd/ pelvic CT since patient has had 2 different episodes of abdominal pain in the past week; Rx for Augmentin  875 mg bid x 7 days; discussed need to increased fiber intake/ drink plenty of water/ try to limit constipation; ER precautions discussed as well.   No follow-ups on file.  Orders Placed This Encounter  Procedures   CT ABDOMEN PELVIS W CONTRAST     Standing Status:   Future    Expiration Date:   06/12/2025    If indicated for the ordered procedure, I authorize the administration of contrast media per Radiology protocol:   Yes    Does the patient have a contrast media/X-ray dye allergy?:   No    Preferred imaging location?:   [facility]    If indicated for the ordered procedure, I authorize the administration of oral contrast media per Radiology protocol:   Yes    Requested Prescriptions   Signed Prescriptions Disp Refills   amoxicillin -clavulanate (AUGMENTIN ) 875-125 MG tablet 14 tablet 0    Sig: Take 1 tablet by mouth 2 (two) times daily for 7 days.

## 2024-06-12 NOTE — Telephone Encounter (Signed)
 FYI. Printed letter and should be on my printer.

## 2024-06-12 NOTE — Telephone Encounter (Signed)
 Copied from CRM [id number]. Topic: General - Other >> Jun 12, 2024  8:30 AM Antwanette L wrote: Reason for CRM: Patient needs a confirmation letter saying he completed his physical on 06/10/24 with Dr. Saguier. The patient would like to pick up the letter today after his appt with Leita Elbe at 2:40pm.

## 2024-06-13 ENCOUNTER — Ambulatory Visit: Payer: Self-pay | Admitting: Medical

## 2024-06-13 MED ORDER — ATORVASTATIN CALCIUM 10 MG PO TABS: 10.0000 mg | ORAL_TABLET | Freq: Every day | ORAL | 3 refills | Status: DC

## 2024-06-13 NOTE — Addendum Note (Signed)
 Addended by: DORINA DALLAS HERO on: 06/13/2024 12:11 AM   Modules accepted: Orders

## 2024-06-24 ENCOUNTER — Ambulatory Visit: Payer: PRIVATE HEALTH INSURANCE | Admitting: Medical

## 2024-06-24 ENCOUNTER — Telehealth: Payer: Self-pay | Admitting: Medical

## 2024-06-24 NOTE — Telephone Encounter (Signed)
 Copied from CRM [id number]. Topic: Referral - Prior Authorization Question >> Jun 24, 2024  8:41 AM Berneda FALCON wrote: Reason for CRM: Arland from [facility] requesting the authorization for the CT. Patient has an appt on 8/7 and needs approval for this please. And you can update the CT in the system as well so she can see it in the referral.   [phone] (direct line)

## 2024-06-25 ENCOUNTER — Ambulatory Visit: Payer: Self-pay | Admitting: General Surgery

## 2024-06-27 ENCOUNTER — Ambulatory Visit (INDEPENDENT_AMBULATORY_CARE_PROVIDER_SITE_OTHER): Payer: PRIVATE HEALTH INSURANCE | Admitting: Medical

## 2024-06-27 ENCOUNTER — Encounter: Payer: Self-pay | Admitting: Medical

## 2024-06-27 VITALS — BP 130/80 | HR 80 | Resp 15 | Ht 65.0 in | Wt 224.6 lb

## 2024-06-27 DIAGNOSIS — E785 Hyperlipidemia, unspecified: Secondary | ICD-10-CM | POA: Diagnosis not present

## 2024-06-27 DIAGNOSIS — D1722 Benign lipomatous neoplasm of skin and subcutaneous tissue of left arm: Secondary | ICD-10-CM

## 2024-06-27 DIAGNOSIS — Z23 Encounter for immunization: Secondary | ICD-10-CM

## 2024-06-27 DIAGNOSIS — R4189 Other symptoms and signs involving cognitive functions and awareness: Secondary | ICD-10-CM

## 2024-06-27 DIAGNOSIS — I10 Essential (primary) hypertension: Secondary | ICD-10-CM

## 2024-06-27 MED ORDER — OLMESARTAN MEDOXOMIL-HCTZ 40-25 MG PO TABS: 1.0000 | ORAL_TABLET | Freq: Every day | ORAL | 3 refills | Status: AC

## 2024-06-27 MED ORDER — BUPROPION HCL ER (XL) 150 MG PO TB24: 150.0000 mg | ORAL_TABLET | Freq: Every day | ORAL | 11 refills | Status: AC

## 2024-06-27 NOTE — Patient Instructions (Signed)
 Essential hypertension Blood pressure controlled with amlodipine  and olmesartan  HCTZ. Metoprolol  not in use. - Continue amlodipine  10 mg daily. - Continue olmesartan  HCTZ 40/25 mg daily. - Discontinue metoprolol  from medication list. - Refill amlodipine  prescription.  Hyperlipidemia Cholesterol elevated: total 297 mg/dL, LDL 774 mg/dL. Cardiovascular risk 12.3%. No atorvastatin  side effects. - Continue atorvastatin . - Adopt low cholesterol diet. - Increase physical activity. - Order lipid and metabolic panels in 3 months.  Attention difficulty Attention improved with Wellbutrin  XL 150 mg daily. Preferred due to fewer side effects than stimulants. - Continue Wellbutrin  XL 150 mg daily. - Provide 11 refills for Wellbutrin .  Probable left arm lipoma Probable lipoma on left arm. Referral to general surgeon in progress. - Proceed with scheduled appointment with general surgeon.  Shingrix vaccine today. 1st in series. On discussion had planned to give on last visit but we both agree you did not get. Second in series 2-6 months.  Follow up date to be determined after you do future labs in 3 months. Sooner if needed

## 2024-06-27 NOTE — Progress Notes (Signed)
 Subjective:    Patient ID: Eric Obrien, male    DOB: [birth year], 58 y.o.   MRN: [id number]  HPI  Eric Obrien is a 58 year old male who presents for a follow-up visit after a wellness exam.  He has a history of hypertension, currently managed with amlodipine  10 mg daily and olmesartan  HCTZ 40/25 mg daily. He previously took metoprolol  but has discontinued it.  He experiences attention difficulties and is taking Wellbutrin  XL 150 mg daily, which has significantly improved his symptoms. No side effect reported.  He has a probable lipoma and has been referred to a Development worker, international aid, with an appointment scheduled for the following week.  Recent lab results indicated elevated cholesterol levels, with a total cholesterol of 297 mg/dL and LDL of 774 mg/dL. He has started atorvastatin  and reports no side effects.  last labs showed metabolic panel was normal. cbc was normal, Prostate protein levels were also normal.       Review of Systems  Constitutional:  Negative for chills and fatigue.  HENT:  Negative for congestion and ear discharge.   Respiratory:  Negative for chest tightness, shortness of breath and wheezing.   Cardiovascular:  Negative for chest pain and palpitations.  Gastrointestinal:  Negative for abdominal pain, diarrhea, nausea and vomiting.  Genitourinary:  Negative for dysuria, frequency, penile pain and urgency.  Musculoskeletal:  Negative for back pain.  Neurological:  Negative for dizziness, weakness and light-headedness.  Hematological:  Negative for adenopathy.  Psychiatric/Behavioral:  Positive for decreased concentration. Negative for agitation, behavioral problems, hallucinations and sleep disturbance. The patient is not nervous/anxious.        Improved with wellbutrin     Past Medical History:  Diagnosis Date   Hyperlipidemia    on meds   Hypertension    on meds   Sleep apnea    uses CPAP     Social History   Socioeconomic History   Marital status:  Married    Spouse name: Not on file   Number of children: 2   Years of education: Not on file   Highest education level: Not on file  Occupational History   Occupation: [employer]  Tobacco Use   Smoking status: Never   Smokeless tobacco: Never  Vaping Use   Vaping status: Never Used  Substance and Sexual Activity   Alcohol use: Not Currently    Alcohol/week: 2.0 standard drinks of alcohol    Types: 2 Standard drinks or equivalent per week    Comment: rarely   Drug use: No   Sexual activity: Not Currently  Other Topics Concern   Not on file  Social History Narrative   Lives at home with spouse and children   Right handed   Caffeine: about 8 cups/day of coffee   Social Drivers of Corporate investment banker Strain: Low Risk  (06/20/2019)   Overall Financial Resource Strain (CARDIA)    Difficulty of Paying Living Expenses: Not hard at all  Food Insecurity: No Food Insecurity (06/20/2019)   Hunger Vital Sign    Worried About Running Out of Food in the Last Year: Never true    Ran Out of Food in the Last Year: Never true  Transportation Needs: No Transportation Needs (06/20/2019)   PRAPARE - Administrator, Civil Service (Medical): No    Lack of Transportation (Non-Medical): No  Physical Activity: Sufficiently Active (06/20/2019)   Exercise Vital Sign    Days of Exercise per Week: 5 days  Minutes of Exercise per Session: 50 min  Stress: Stress Concern Present (06/20/2019)   [employer] of Occupational Health - Occupational Stress Questionnaire    Feeling of Stress : To some extent  Social Connections: Unknown (06/20/2019)   Social Connection and Isolation Panel    Frequency of Communication with Friends and Family: Three times a week    Frequency of Social Gatherings with Friends and Family: Twice a week    Attends Religious Services: Patient declined    Database administrator or Organizations: Patient declined    Attends Banker Meetings: Patient  declined    Marital Status: Patient declined  Intimate Partner Violence: Not At Risk (06/20/2019)   Humiliation, Afraid, Rape, and Kick questionnaire    Fear of Current or Ex-Partner: No    Emotionally Abused: No    Physically Abused: No    Sexually Abused: No    Past Surgical History:  Procedure Laterality Date   NO PAST SURGERIES      Family History  Problem Relation Age of Onset   Crohn's disease Other    Colon cancer Neg Hx    Esophageal cancer Neg Hx    Colon polyps Neg Hx    Stomach cancer Neg Hx    Rectal cancer Neg Hx     No Known Allergies  Current Outpatient Medications on File Prior to Visit  Medication Sig Dispense Refill   amLODipine  (NORVASC ) 10 MG tablet TAKE 1 TABLET(10 MG) BY MOUTH DAILY 90 tablet 3   atorvastatin  (LIPITOR) 10 MG tablet Take 1 tablet (10 mg total) by mouth daily. 90 tablet 3   azelastine  (ASTELIN ) 0.1 % nasal spray Place 1 spray into both nostrils 2 (two) times daily. Use in each nostril as directed 30 mL 12   Blood Pressure Monitoring (BLOOD PRESSURE CUFF) MISC 1 each by Does not apply route daily. 1 each 0   buPROPion  (WELLBUTRIN  XL) 150 MG 24 hr tablet Take 1 tablet (150 mg total) by mouth daily. 30 tablet 0   cetirizine  (ZYRTEC ) 10 MG tablet Take 1 tablet (10 mg total) by mouth daily. 30 tablet 11   fluocinonide  ointment (LIDEX ) 0.05 % Apply 1 application topically 2 (two) times daily. 30 g 0   fluticasone  (FLONASE ) 50 MCG/ACT nasal spray Place 2 sprays into both nostrils daily. 16 g 6   olopatadine  (PATANOL) 0.1 % ophthalmic solution Place 1 drop into both eyes 2 (two) times daily. 5 mL 12   RESTASIS 0.05 % ophthalmic emulsion      No current facility-administered medications on file prior to visit.    BP 130/80   Pulse 80   Resp 15   Ht 5' 5 (1.651 m)   Wt 224 lb 9.6 oz (101.9 kg)   SpO2 98%   BMI 37.38 kg/m        Objective:   Physical Exam  General Mental Status- Alert. General Appearance- Not in acute distress.    Skin General: Color- Normal Color. Moisture- Normal Moisture.  Neck Carotid Arteries- Normal color. Moisture- Normal Moisture. No carotid bruits. No JVD.  Chest and Lung Exam Auscultation: Breath Sounds:-CTA  Cardiovascular Auscultation:Rythm- RRR Murmurs & Other Heart Sounds:Auscultation of the heart reveals- No Murmurs.  Abdomen Inspection:-Inspeection Normal. Palpation/Percussion:Note:No mass. Palpation and Percussion of the abdomen reveal- Non Tender, Non Distended + BS, no rebound or guarding.   Neurologic Cranial Nerve exam:- CN III-XII intact(No nystagmus), symmetric smile. Strength:- 5/5 equal and symmetric strength both upper and lower extremities.  Assessment & Plan:   Patient Instructions  Essential hypertension Blood pressure controlled with amlodipine  and olmesartan  HCTZ. Metoprolol  not in use. - Continue amlodipine  10 mg daily. - Continue olmesartan  HCTZ 40/25 mg daily. - Discontinue metoprolol  from medication list. - Refill amlodipine  prescription.  Hyperlipidemia Cholesterol elevated: total 297 mg/dL, LDL 774 mg/dL. Cardiovascular risk 12.3%. No atorvastatin  side effects. - Continue atorvastatin . - Adopt low cholesterol diet. - Increase physical activity. - Order lipid and metabolic panels in 3 months.  Attention difficulty Attention improved with Wellbutrin  XL 150 mg daily. Preferred due to fewer side effects than stimulants. - Continue Wellbutrin  XL 150 mg daily. - Provide 11 refills for Wellbutrin .  Probable left arm lipoma Probable lipoma on left arm. Referral to general surgeon in progress. - Proceed with scheduled appointment with general surgeon.  Shingrix vaccine today. 1st in series. On discussion had planned to give on last visit but we both agree you did not get. Second in series 2-6 months.  Follow up date to be determined after you do future labs in 3 months. Sooner if needed   [employer], PA-C

## 2024-07-03 ENCOUNTER — Encounter (HOSPITAL_BASED_OUTPATIENT_CLINIC_OR_DEPARTMENT_OTHER): Payer: Self-pay

## 2024-07-03 ENCOUNTER — Ambulatory Visit (HOSPITAL_BASED_OUTPATIENT_CLINIC_OR_DEPARTMENT_OTHER)
Admission: RE | Admit: 2024-07-03 | Discharge: 2024-07-03 | Disposition: A | Discharge status: Home / Self Care | Payer: Self-pay | Source: Ambulatory Visit | Attending: Family | Admitting: Family

## 2024-07-03 DIAGNOSIS — R109 Unspecified abdominal pain: Secondary | ICD-10-CM | POA: Insufficient documentation

## 2024-07-03 MED ORDER — IOHEXOL 300 MG/ML  SOLN
100.0000 mL | Freq: Once | INTRAMUSCULAR | Status: AC | PRN
  Administered 2024-07-03: 100 mL via INTRAVENOUS

## 2024-07-07 ENCOUNTER — Ambulatory Visit: Payer: Self-pay | Admitting: Family

## 2024-07-08 ENCOUNTER — Ambulatory Visit: Payer: Self-pay

## 2024-07-08 NOTE — Telephone Encounter (Addendum)
 FYI Only or Action Required?: FYI only for provider.  Patient was last seen in primary care on 06/27/2024 by Dorina Loving, PA-C.  Called Nurse Triage reporting Advice Only.  Symptoms began today.  Interventions attempted: Nothing.  Symptoms are: stable.  Triage Disposition: Information or Advice Only Call  Patient/caregiver understands and will follow disposition?: Yes    Copied from CRM [id number]. Topic: Clinical - Medical Advice >> Jul 08, 2024 12:02 PM Eric Obrien wrote: Reason for CRM: Patient is calling because he finished the cycle of antibiotics that was prescribed and patient is feeling better; however, his taste has not come back and he wanted to know if he needed additional medication or will it come back on it's own.

## 2024-07-29 ENCOUNTER — Other Ambulatory Visit: Payer: Self-pay

## 2024-07-29 ENCOUNTER — Encounter (HOSPITAL_BASED_OUTPATIENT_CLINIC_OR_DEPARTMENT_OTHER): Payer: Self-pay | Admitting: General Surgery

## 2024-08-01 ENCOUNTER — Other Ambulatory Visit: Payer: Self-pay

## 2024-08-01 ENCOUNTER — Encounter (HOSPITAL_BASED_OUTPATIENT_CLINIC_OR_DEPARTMENT_OTHER)
Admission: RE | Admit: 2024-08-01 | Discharge: 2024-08-01 | Disposition: A | Discharge status: Home / Self Care | Payer: PRIVATE HEALTH INSURANCE | Source: Ambulatory Visit | Attending: General Surgery | Admitting: General Surgery

## 2024-08-01 DIAGNOSIS — Z01818 Encounter for other preprocedural examination: Secondary | ICD-10-CM | POA: Insufficient documentation

## 2024-08-01 DIAGNOSIS — Z79899 Other long term (current) drug therapy: Secondary | ICD-10-CM | POA: Diagnosis not present

## 2024-08-01 DIAGNOSIS — I10 Essential (primary) hypertension: Secondary | ICD-10-CM | POA: Diagnosis not present

## 2024-08-01 DIAGNOSIS — D1722 Benign lipomatous neoplasm of skin and subcutaneous tissue of left arm: Secondary | ICD-10-CM | POA: Diagnosis present

## 2024-08-01 DIAGNOSIS — G4730 Sleep apnea, unspecified: Secondary | ICD-10-CM | POA: Diagnosis not present

## 2024-08-01 LAB — BASIC METABOLIC PANEL WITH GFR
Anion gap: 11 (ref 5–15)
BUN: 14 mg/dL (ref 6–20)
CO2: 25 mmol/L (ref 22–32)
Calcium: 9.3 mg/dL (ref 8.9–10.3)
Chloride: 104 mmol/L (ref 98–111)
Creatinine, Ser: 1.31 mg/dL — ABNORMAL HIGH (ref 0.61–1.24)
GFR, Estimated: >60 mL/min (ref >60)
Glucose, Bld: 84 mg/dL (ref 70–99)
Potassium: 3.7 mmol/L (ref 3.5–5.1)
Sodium: 140 mmol/L (ref 135–145)

## 2024-08-01 MED ORDER — CHLORHEXIDINE GLUCONATE CLOTH 2 % EX PADS: 6.0000 | MEDICATED_PAD | Freq: Once | CUTANEOUS | Status: DC

## 2024-08-01 NOTE — Progress Notes (Signed)
 Reminded patient to come to [facility] today for updated lab work and an EKG prior to surgery scheduled 08/04/24 with Dr. Ann.

## 2024-08-01 NOTE — Progress Notes (Signed)
 BMP collected, updated EKG obtained at PAT appointment. Surgical soap given to patient, instructions given, patient verbalized understanding.

## 2024-08-04 ENCOUNTER — Ambulatory Visit (HOSPITAL_BASED_OUTPATIENT_CLINIC_OR_DEPARTMENT_OTHER)
Admission: RE | Admit: 2024-08-04 | Discharge: 2024-08-04 | Disposition: A | Discharge status: Home / Self Care | Payer: PRIVATE HEALTH INSURANCE | Attending: General Surgery | Admitting: General Surgery

## 2024-08-04 ENCOUNTER — Ambulatory Visit (HOSPITAL_BASED_OUTPATIENT_CLINIC_OR_DEPARTMENT_OTHER): Payer: PRIVATE HEALTH INSURANCE | Admitting: Anesthesiology

## 2024-08-04 ENCOUNTER — Encounter (HOSPITAL_BASED_OUTPATIENT_CLINIC_OR_DEPARTMENT_OTHER)
Admission: RE | Disposition: A | Discharge status: Home / Self Care | Payer: Self-pay | Source: Home / Self Care | Attending: General Surgery

## 2024-08-04 ENCOUNTER — Other Ambulatory Visit: Payer: Self-pay

## 2024-08-04 ENCOUNTER — Encounter (HOSPITAL_BASED_OUTPATIENT_CLINIC_OR_DEPARTMENT_OTHER): Payer: Self-pay | Admitting: General Surgery

## 2024-08-04 DIAGNOSIS — D1722 Benign lipomatous neoplasm of skin and subcutaneous tissue of left arm: Secondary | ICD-10-CM | POA: Diagnosis not present

## 2024-08-04 DIAGNOSIS — I10 Essential (primary) hypertension: Secondary | ICD-10-CM | POA: Insufficient documentation

## 2024-08-04 DIAGNOSIS — I119 Hypertensive heart disease without heart failure: Secondary | ICD-10-CM

## 2024-08-04 DIAGNOSIS — G4730 Sleep apnea, unspecified: Secondary | ICD-10-CM | POA: Insufficient documentation

## 2024-08-04 DIAGNOSIS — Z79899 Other long term (current) drug therapy: Secondary | ICD-10-CM | POA: Insufficient documentation

## 2024-08-04 HISTORY — DX: Depression, unspecified: F32.A

## 2024-08-04 HISTORY — DX: Anxiety disorder, unspecified: F41.9

## 2024-08-04 HISTORY — PX: EXCISION, MASS, UPPER EXTREMITY: SHX7567

## 2024-08-04 SURGERY — Surgical Case
Anesthesia: *Unknown

## 2024-08-04 SURGERY — EXCISION, MASS, UPPER EXTREMITY
Anesthesia: General | Site: Arm Lower | Laterality: Left

## 2024-08-04 MED ORDER — LACTATED RINGERS IV SOLN: INTRAVENOUS | Status: DC

## 2024-08-04 MED ORDER — DEXAMETHASONE SODIUM PHOSPHATE 10 MG/ML IJ SOLN
INTRAMUSCULAR | Status: DC | PRN
  Administered 2024-08-04: 10 mg via INTRAVENOUS

## 2024-08-04 MED ORDER — CEFAZOLIN SODIUM-DEXTROSE 2-4 GM/100ML-% IV SOLN
2.0000 g | INTRAVENOUS | Status: AC
  Administered 2024-08-04: 2 g via INTRAVENOUS

## 2024-08-04 MED ORDER — ACETAMINOPHEN 500 MG PO TABS
1000.0000 mg | ORAL_TABLET | ORAL | Status: AC
  Administered 2024-08-04: 1000 mg via ORAL

## 2024-08-04 MED ORDER — 0.9 % SODIUM CHLORIDE (POUR BTL) OPTIME
TOPICAL | Status: DC | PRN
  Administered 2024-08-04: 1000 mL

## 2024-08-04 MED ORDER — LIDOCAINE 2% (20 MG/ML) 5 ML SYRINGE
INTRAMUSCULAR | Status: AC
  Filled 2024-08-04: qty 5

## 2024-08-04 MED ORDER — GABAPENTIN 300 MG PO CAPS
300.0000 mg | ORAL_CAPSULE | ORAL | Status: AC
  Administered 2024-08-04: 300 mg via ORAL

## 2024-08-04 MED ORDER — OXYCODONE HCL 5 MG PO TABS: 5.0000 mg | ORAL_TABLET | Freq: Once | ORAL | Status: DC | PRN

## 2024-08-04 MED ORDER — FENTANYL CITRATE (PF) 100 MCG/2ML IJ SOLN: 25.0000 ug | INTRAMUSCULAR | Status: DC | PRN

## 2024-08-04 MED ORDER — ONDANSETRON HCL 4 MG/2ML IJ SOLN
INTRAMUSCULAR | Status: AC
  Filled 2024-08-04: qty 2

## 2024-08-04 MED ORDER — OXYCODONE HCL 5 MG/5ML PO SOLN: 5.0000 mg | Freq: Once | ORAL | Status: DC | PRN

## 2024-08-04 MED ORDER — ENOXAPARIN SODIUM 40 MG/0.4ML IJ SOSY
PREFILLED_SYRINGE | INTRAMUSCULAR | Status: AC
Start: 2024-08-04 — End: 2024-08-04
  Filled 2024-08-04: qty 0.4

## 2024-08-04 MED ORDER — FENTANYL CITRATE (PF) 100 MCG/2ML IJ SOLN
INTRAMUSCULAR | Status: AC
  Filled 2024-08-04: qty 2

## 2024-08-04 MED ORDER — CELECOXIB 200 MG PO CAPS
ORAL_CAPSULE | ORAL | Status: AC
  Filled 2024-08-04: qty 1

## 2024-08-04 MED ORDER — MIDAZOLAM HCL 2 MG/2ML IJ SOLN
INTRAMUSCULAR | Status: AC
Start: 2024-08-04 — End: 2024-08-04
  Filled 2024-08-04: qty 2

## 2024-08-04 MED ORDER — MIDAZOLAM HCL 5 MG/5ML IJ SOLN
INTRAMUSCULAR | Status: DC | PRN
  Administered 2024-08-04: 2 mg via INTRAVENOUS

## 2024-08-04 MED ORDER — OXYCODONE HCL 5 MG PO TABS: 5.0000 mg | ORAL_TABLET | Freq: Four times a day (QID) | ORAL | 0 refills | Status: AC | PRN

## 2024-08-04 MED ORDER — EPHEDRINE 5 MG/ML INJ
INTRAVENOUS | Status: AC
  Filled 2024-08-04: qty 5

## 2024-08-04 MED ORDER — DEXAMETHASONE SODIUM PHOSPHATE 10 MG/ML IJ SOLN
INTRAMUSCULAR | Status: AC
  Filled 2024-08-04: qty 1

## 2024-08-04 MED ORDER — GABAPENTIN 300 MG PO CAPS
ORAL_CAPSULE | ORAL | Status: AC
  Filled 2024-08-04: qty 1

## 2024-08-04 MED ORDER — PHENYLEPHRINE 80 MCG/ML (10ML) SYRINGE FOR IV PUSH (FOR BLOOD PRESSURE SUPPORT)
PREFILLED_SYRINGE | INTRAVENOUS | Status: AC
  Filled 2024-08-04: qty 10

## 2024-08-04 MED ORDER — EPHEDRINE SULFATE (PRESSORS) 50 MG/ML IJ SOLN
INTRAMUSCULAR | Status: DC | PRN
  Administered 2024-08-04: 5 mg via INTRAVENOUS

## 2024-08-04 MED ORDER — ONDANSETRON HCL 4 MG/2ML IJ SOLN
INTRAMUSCULAR | Status: DC | PRN
  Administered 2024-08-04: 4 mg via INTRAVENOUS

## 2024-08-04 MED ORDER — ENOXAPARIN SODIUM 40 MG/0.4ML IJ SOSY
40.0000 mg | PREFILLED_SYRINGE | Freq: Once | INTRAMUSCULAR | Status: AC
  Administered 2024-08-04: 40 mg via SUBCUTANEOUS

## 2024-08-04 MED ORDER — CELECOXIB 200 MG PO CAPS
200.0000 mg | ORAL_CAPSULE | ORAL | Status: AC
  Administered 2024-08-04: 200 mg via ORAL

## 2024-08-04 MED ORDER — BUPIVACAINE-EPINEPHRINE (PF) 0.25% -1:200000 IJ SOLN
INTRAMUSCULAR | Status: AC
  Filled 2024-08-04: qty 30

## 2024-08-04 MED ORDER — CEFAZOLIN SODIUM-DEXTROSE 2-4 GM/100ML-% IV SOLN
INTRAVENOUS | Status: AC
  Filled 2024-08-04: qty 100

## 2024-08-04 MED ORDER — LIDOCAINE HCL (CARDIAC) PF 100 MG/5ML IV SOSY
PREFILLED_SYRINGE | INTRAVENOUS | Status: DC | PRN
  Administered 2024-08-04: 100 mg via INTRAVENOUS

## 2024-08-04 MED ORDER — PHENYLEPHRINE HCL (PRESSORS) 10 MG/ML IV SOLN
INTRAVENOUS | Status: DC | PRN
Start: 2024-08-04 — End: 2024-08-04
  Administered 2024-08-04: 80 ug via INTRAVENOUS
  Administered 2024-08-04 (×2): 120 ug via INTRAVENOUS
  Administered 2024-08-04 (×4): 80 ug via INTRAVENOUS

## 2024-08-04 MED ORDER — PROPOFOL 10 MG/ML IV BOLUS
INTRAVENOUS | Status: AC
  Filled 2024-08-04: qty 20

## 2024-08-04 MED ORDER — FENTANYL CITRATE (PF) 100 MCG/2ML IJ SOLN
INTRAMUSCULAR | Status: DC | PRN
  Administered 2024-08-04: 50 ug via INTRAVENOUS

## 2024-08-04 MED ORDER — ONDANSETRON HCL 4 MG/2ML IJ SOLN: 4.0000 mg | Freq: Once | INTRAMUSCULAR | Status: DC | PRN

## 2024-08-04 MED ORDER — ACETAMINOPHEN 500 MG PO TABS
ORAL_TABLET | ORAL | Status: AC
  Filled 2024-08-04: qty 2

## 2024-08-04 MED ORDER — BUPIVACAINE-EPINEPHRINE (PF) 0.25% -1:200000 IJ SOLN
INTRAMUSCULAR | Status: DC | PRN
  Administered 2024-08-04: 10 mL

## 2024-08-04 MED ORDER — PROPOFOL 10 MG/ML IV BOLUS
INTRAVENOUS | Status: DC | PRN
  Administered 2024-08-04: 180 mg via INTRAVENOUS

## 2024-08-04 SURGICAL SUPPLY — 39 items
BLADE SURG 10 STRL SS (BLADE) ×1 IMPLANT
BLADE SURG 15 STRL LF DISP TIS (BLADE) ×1 IMPLANT
BNDG GAUZE DERMACEA FLUFF 4 (GAUZE/BANDAGES/DRESSINGS) IMPLANT
CANISTER SUCT 1200ML W/VALVE (MISCELLANEOUS) ×1 IMPLANT
CHLORAPREP W/TINT 26 (MISCELLANEOUS) ×1 IMPLANT
CLIP APPLIE 9.375 MED OPEN (MISCELLANEOUS) IMPLANT
COVER BACK TABLE 60X90IN (DRAPES) ×1 IMPLANT
COVER MAYO STAND STRL (DRAPES) ×1 IMPLANT
DERMABOND ADVANCED .7 DNX12 (GAUZE/BANDAGES/DRESSINGS) IMPLANT
DRAIN CHANNEL 19F RND (DRAIN) IMPLANT
DRAPE LAPAROSCOPIC ABDOMINAL (DRAPES) ×1 IMPLANT
DRAPE LAPAROTOMY 100X72 PEDS (DRAPES) ×1 IMPLANT
DRAPE UTILITY XL STRL (DRAPES) ×1 IMPLANT
ELECT CAUTERY BLADE 6.4 (BLADE) IMPLANT
ELECT COATED BLADE 2.86 ST (ELECTRODE) ×1 IMPLANT
ELECTRODE REM PT RTRN 9FT ADLT (ELECTROSURGICAL) ×1 IMPLANT
EVACUATOR SILICONE 100CC (DRAIN) IMPLANT
GAUZE SPONGE 4X4 12PLY STRL (GAUZE/BANDAGES/DRESSINGS) ×1 IMPLANT
GLOVE BIOGEL PI MICRO STRL 6 (GLOVE) ×1 IMPLANT
GLOVE INDICATOR 6.5 STRL GRN (GLOVE) ×1 IMPLANT
GOWN STRL REUS W/ TWL LRG LVL3 (GOWN DISPOSABLE) ×2 IMPLANT
NDL HYPO 25X1 1.5 SAFETY (NEEDLE) ×1 IMPLANT
NEEDLE HYPO 25X1 1.5 SAFETY (NEEDLE) ×1 IMPLANT
NS IRRIG 1000ML POUR BTL (IV SOLUTION) ×1 IMPLANT
PACK BASIN DAY SURGERY FS (CUSTOM PROCEDURE TRAY) ×1 IMPLANT
PENCIL SMOKE EVACUATOR (MISCELLANEOUS) ×1 IMPLANT
SLEEVE SCD COMPRESS KNEE MED (STOCKING) ×1 IMPLANT
SPONGE T-LAP 4X18 ~~LOC~~+RFID (SPONGE) ×1 IMPLANT
STAPLER SKIN PROX WIDE 3.9 (STAPLE) ×1 IMPLANT
SUT ETHILON 2 0 FS 18 (SUTURE) IMPLANT
SUT MNCRL AB 4-0 PS2 18 (SUTURE) IMPLANT
SUT VIC AB 3-0 SH 27X BRD (SUTURE) ×1 IMPLANT
SUT VIC AB 3-0 SH 8-18 (SUTURE) ×1 IMPLANT
SYR BULB EAR ULCER 3OZ GRN STR (SYRINGE) ×1 IMPLANT
SYR CONTROL 10ML LL (SYRINGE) IMPLANT
TOWEL GREEN STERILE FF (TOWEL DISPOSABLE) ×1 IMPLANT
TUBE CONNECTING 20X1/4 (TUBING) ×1 IMPLANT
UNDERPAD 30X36 HEAVY ABSORB (UNDERPADS AND DIAPERS) ×1 IMPLANT
YANKAUER SUCT BULB TIP NO VENT (SUCTIONS) ×1 IMPLANT

## 2024-08-04 NOTE — Discharge Instructions (Signed)
  Post Anesthesia Home Care Instructions  Activity: Get plenty of rest for the remainder of the day. A responsible individual must stay with you for 24 hours following the procedure.  For the next 24 hours, DO NOT: -Drive a car -Advertising copywriter -Drink alcoholic beverages -Take any medication unless instructed by your physician -Make any legal decisions or sign important papers.  Meals: Start with liquid foods such as gelatin or soup. Progress to regular foods as tolerated. Avoid greasy, spicy, heavy foods. If nausea and/or vomiting occur, drink only clear liquids until the nausea and/or vomiting subsides. Call your physician if vomiting continues.  Special Instructions/Symptoms: Your throat may feel dry or sore from the anesthesia or the breathing tube placed in your throat during surgery. If this causes discomfort, gargle with warm salt water. The discomfort should disappear within 24 hours.  If you had a scopolamine patch placed behind your ear for the management of post- operative nausea and/or vomiting:  1. The medication in the patch is effective for 72 hours, after which it should be removed.  Wrap patch in a tissue and discard in the trash. Wash hands thoroughly with soap and water. 2. You may remove the patch earlier than 72 hours if you experience unpleasant side effects which may include dry mouth, dizziness or visual disturbances. 3. Avoid touching the patch. Wash your hands with soap and water after contact with the patch.      If needed next dose of tylenol  may be taken at1230

## 2024-08-04 NOTE — Transfer of Care (Signed)
 Immediate Anesthesia Transfer of Care Note  Patient: Eric Obrien  Procedure(s) Performed: EXCISION, MASS, UPPER EXTREMITY (Left: Arm Lower)  Patient Location: PACU  Anesthesia Type:General  Level of Consciousness: drowsy  Airway & Oxygen Therapy: Patient Spontanous Breathing and Patient connected to face mask oxygen  Post-op Assessment: Report given to RN and Post -op Vital signs reviewed and stable  Post vital signs: Reviewed and stable  Last Vitals:  Vitals Value Taken Time  BP 124/87 08/04/24 08:30  Temp    Pulse 81 08/04/24 08:31  Resp 14 08/04/24 08:31  SpO2 98 % 08/04/24 08:31  Vitals shown include unfiled device data.  Last Pain:  Vitals:   08/04/24 0631  TempSrc: Temporal  PainSc: 0-No pain      Patients Stated Pain Goal: 4 (08/04/24 0631)  Complications: No notable events documented.

## 2024-08-04 NOTE — Anesthesia Preprocedure Evaluation (Addendum)
 Anesthesia Evaluation  Patient identified by MRN, date of birth, ID band Patient awake    Reviewed: Allergy & Precautions, NPO status , Patient's Chart, lab work & pertinent test results  History of Anesthesia Complications Negative for: history of anesthetic complications  Airway Mallampati: III  TM Distance: >3 FB Neck ROM: Full    Dental no notable dental hx. (+) Teeth Intact   Pulmonary sleep apnea and Continuous Positive Airway Pressure Ventilation , neg COPD, Patient abstained from smoking.Not current smoker   Pulmonary exam normal breath sounds clear to auscultation       Cardiovascular Exercise Tolerance: Good METShypertension, Pt. on medications (-) CAD and (-) Past MI (-) dysrhythmias  Rhythm:Regular Rate:Normal - Systolic murmurs    Neuro/Psych  PSYCHIATRIC DISORDERS Anxiety Depression    negative neurological ROS     GI/Hepatic ,neg GERD  ,,(+)     (-) substance abuse    Endo/Other  neg diabetes    Renal/GU negative Renal ROS     Musculoskeletal   Abdominal  (+) + obese  Peds  Hematology   Anesthesia Other Findings Past Medical History: No date: Anxiety No date: Depression No date: Hyperlipidemia     Comment:  on meds No date: Hypertension     Comment:  on meds No date: Sleep apnea     Comment:  uses CPAP nightly  Reproductive/Obstetrics                              Anesthesia Physical Anesthesia Plan  ASA: 2  Anesthesia Plan: General   Post-op Pain Management: Tylenol  PO (pre-op)*, Celebrex  PO (pre-op)* and Gabapentin  PO (pre-op)*   Induction: Intravenous  PONV Risk Score and Plan: 2 and Ondansetron , Dexamethasone  and Midazolam   Airway Management Planned: LMA  Additional Equipment: None  Intra-op Plan:   Post-operative Plan: Extubation in OR  Informed Consent: I have reviewed the patients History and Physical, chart, labs and discussed the procedure  including the risks, benefits and alternatives for the proposed anesthesia with the patient or authorized representative who has indicated his/her understanding and acceptance.     Dental advisory given  Plan Discussed with: CRNA and Surgeon  Anesthesia Plan Comments: (Discussed risks of anesthesia with patient, including PONV, sore throat, lip/dental/eye damage. Rare risks discussed as well, such as cardiorespiratory and neurological sequelae, and allergic reactions. Discussed the role of CRNA in patient's perioperative care. Patient understands.)        Anesthesia Quick Evaluation

## 2024-08-04 NOTE — Anesthesia Postprocedure Evaluation (Signed)
 Anesthesia Post Note  Patient: Eric Obrien  Procedure(s) Performed: EXCISION, MASS, UPPER EXTREMITY (Left: Arm Lower)     Patient location during evaluation: PACU Anesthesia Type: General Level of consciousness: awake and alert Pain management: pain level controlled Vital Signs Assessment: post-procedure vital signs reviewed and stable Respiratory status: spontaneous breathing, nonlabored ventilation, respiratory function stable and patient connected to nasal cannula oxygen Cardiovascular status: blood pressure returned to baseline and stable Postop Assessment: no apparent nausea or vomiting Anesthetic complications: no   No notable events documented.  Last Vitals:  Vitals:   08/04/24 0854 08/04/24 0933  BP:  129/82  Pulse: 76 79  Resp: 13 20  Temp:  36.8 C  SpO2: 94% 94%    Last Pain:  Vitals:   08/04/24 0933  TempSrc: Temporal  PainSc: 0-No pain                 [name]

## 2024-08-04 NOTE — Op Note (Signed)
 EXCISION, MASS, UPPER EXTREMITY  Operative Note (CSN: [id number])  Service  Date of Surgery: 08/04/2024 Admit Date: 08/04/2024 Performing Service: General Surgeons and Role:    DEWAINE Silversmith, Richerd, MD - Primary  Op Note Pre-op Diagnosis: lipoma Post-op Diagnosis: Post-Op Diagnosis Codes:    * Lipoma of left upper extremity [D17.22]  Procedure(s): EXCISION, MASS, UPPER EXTREMITY  Findings: 2x3cm lipoma in LUE.  Anesthesia: General Estimated Blood Loss: 5cc  Complications: none Specimens:  ID Type Source Tests Collected by Time Destination  1 : left Arm Lipoma Tissue PATH Other SURGICAL PATHOLOGY Silversmith Richerd, MD 08/04/2024 [phone]     Brief history / Indications for Surgery: Patient with mass to left forearm for several years that causes discomfort due to tightness at skin from mass effect.  Procedure Details  Prior to the procedure, the risks, benefits, complications, treatment options, and expected outcomes were discussed with the patient and/or family, including but not limited to, the risks of bleeding, infection, post-op seroma, post-op hematoma, wound dehiscence, and recurrence.Eric Obrien  Despite the risks, the patient has given informed consent for operative intervention.  The patient was taken to the Operating Room, identified as Eric Obrien and the procedure verified as excision of left forearm lipoma.  Identification pause was held and the above information confirmed.  The patient was placed in the supine position and General endotracheal anesthesia was induced. The left arm was prepped with chloraprep and draped in the typical sterile fashion.  A formal preincision time out was performed.  An longitudinal incision was made overlying the palpable lesion in the left forearm. Electrocautery was use for dissection of lipoma from surrounding tissues and lipoma then divided at posterior attachments. Lipoma removed and sent for pathology.  Wound bed irrigated and hemostasis confirmed. Wound  bed closed in layers using interrupted 3-0 vicryl to approximate subcutaneous tissue, and then interrupted deep dermals. Skin closed with 4-0 monocryl and dermabond.  Instrument, sponge, and needle counts were correct at the conclusion of the case.   Orie Silversmith, MD [facility] Surgery Date: 08/04/2024  Time: 8:23 AM

## 2024-08-04 NOTE — Anesthesia Procedure Notes (Signed)
 Procedure Name: LMA Insertion Date/Time: 08/04/2024 7:26 AM  Performed by: Frost Kayla MATSU, CRNAPre-anesthesia Checklist: Emergency Drugs available, Patient identified, Suction available and Patient being monitored Patient Re-evaluated:Patient Re-evaluated prior to induction Oxygen Delivery Method: Circle system utilized Preoxygenation: Pre-oxygenation with 100% oxygen Induction Type: IV induction LMA: LMA inserted LMA Size: 4.0 Number of attempts: 1 Placement Confirmation: positive ETCO2 Tube secured with: Tape Dental Injury: Teeth and Oropharynx as per pre-operative assessment

## 2024-08-04 NOTE — H&P (Signed)
    HPI  Eric Obrien is an 58 y.o. male who was seen in clinic on 06/25/24 for left forearm lipoma.  Patient states he noted lipoma about a year or two ago. Has not really changed in size to his knowledge. No skin changes, no drainage. No signs of infection. It is not necessarily painful but does cause some discomfort due to tightness of skin overlying lesion.   No changes since last seen in clinic  10 point review of systems is negative except as listed above in HPI.  Objective  Past Medical History: Past Medical History:  Diagnosis Date   Anxiety    Depression    Hyperlipidemia    on meds   Hypertension    on meds   Sleep apnea    uses CPAP nightly    Past Surgical History: Past Surgical History:  Procedure Laterality Date   COLONOSCOPY      Family History:  Family History  Problem Relation Age of Onset   Crohn's disease Other    Colon cancer Neg Hx    Esophageal cancer Neg Hx    Colon polyps Neg Hx    Stomach cancer Neg Hx    Rectal cancer Neg Hx     Social History:  reports that he has never smoked. He has never used smokeless tobacco. He reports current alcohol use of about 2.0 standard drinks of alcohol per week. He reports that he does not use drugs.  Allergies: No Known Allergies  Medications: I have reviewed the patient's current medications.  Labs: Pertinent lab work personally reviewed.  Imaging: Pertinent imaging personally reviewed  Physical Exam Blood pressure 127/81, pulse 69, temperature 97.8 F (36.6 C), temperature source Temporal, resp. rate 14, height 5' 5 (1.651 m), weight 101 kg, SpO2 99%. General: No acute distress, well appearing HEENT: PERRL, hearing grossly normal, mucous membranes moist CV: Regular rate and rhythm Pulm: Normal work of breathing on room air Abd: Soft, nontender, nondistended Extremities: Warm and well perfused, 2x3cm lipoma to left volar forearm, nontender, no skin changes, mobile, soft. Neuro: A&O x4, no focal  neurologic deficits Psych: Appropriate mood and effect     Assessment   Eric Obrien is an 58 y.o. male with left forearm lipoma  Plan  - Proceed to OR for excision of left forearm lipoma - Discussed risks of surgery including but not limited to: bleeding, infection, seroma, hematoma, wound dehiscence, recurrence. Patient understands these risks and wishes to proceed with surgery.    Orie Silversmith, MD [facility] Surgery

## 2024-08-05 ENCOUNTER — Encounter (HOSPITAL_BASED_OUTPATIENT_CLINIC_OR_DEPARTMENT_OTHER): Payer: Self-pay | Admitting: General Surgery

## 2024-08-05 LAB — SURGICAL PATHOLOGY

## 2024-09-25 NOTE — Addendum Note (Signed)
 Addended by: TRUDY CURVIN RAMAN on: 09/25/2024 02:54 PM   Modules accepted: Orders

## 2024-10-02 ENCOUNTER — Telehealth: Payer: Self-pay

## 2024-10-02 NOTE — Telephone Encounter (Signed)
 Appointment rescheduled for 10/06/24   Copied from CRM #[id number]. Topic: Appointments - Appointment Cancel/Reschedule >> Oct 02, 2024 12:06 PM Rosina BIRCH wrote: Patient/patient representative is calling to cancel or reschedule an appointment. Refer to attachments for appointment information.   Patient does not have insurance on file and I tried twice to run the insurance but it could not be processed

## 2024-10-03 ENCOUNTER — Other Ambulatory Visit: Payer: PRIVATE HEALTH INSURANCE

## 2024-10-06 ENCOUNTER — Other Ambulatory Visit: Payer: PRIVATE HEALTH INSURANCE

## 2024-10-06 DIAGNOSIS — E785 Hyperlipidemia, unspecified: Secondary | ICD-10-CM

## 2024-10-06 LAB — COMPLETE METABOLIC PANEL WITHOUT GFR
AG Ratio: 1.6 (calc) (ref 1.0–2.5)
ALT: 24 U/L (ref 9–46)
AST: 27 U/L (ref 10–35)
Albumin: 4.7 g/dL (ref 3.6–5.1)
Alkaline phosphatase (APISO): 69 U/L (ref 35–144)
BUN/Creatinine Ratio: 12 (calc) (ref 6–22)
BUN: 19 mg/dL (ref 7–25)
CO2: 30 mmol/L (ref 20–32)
Calcium: 9.7 mg/dL (ref 8.6–10.3)
Chloride: 99 mmol/L (ref 98–110)
Creat: 1.54 mg/dL — ABNORMAL HIGH (ref 0.70–1.30)
Globulin: 3 g/dL (ref 1.9–3.7)
Glucose, Bld: 117 mg/dL — ABNORMAL HIGH (ref 65–99)
Potassium: 4.1 mmol/L (ref 3.5–5.3)
Sodium: 138 mmol/L (ref 135–146)
Total Bilirubin: 0.6 mg/dL (ref 0.2–1.2)
Total Protein: 7.7 g/dL (ref 6.1–8.1)

## 2024-10-06 LAB — LIPID PANEL
Cholesterol: 303 mg/dL — ABNORMAL HIGH (ref <200)
HDL: 45 mg/dL (ref >=40)
LDL Cholesterol (Calc): 221 mg/dL — ABNORMAL HIGH
Non-HDL Cholesterol (Calc): 258 mg/dL — ABNORMAL HIGH (ref <130)
Total CHOL/HDL Ratio: 6.7 (calc) — ABNORMAL HIGH (ref <5.0)
Triglycerides: 194 mg/dL — ABNORMAL HIGH (ref <150)

## 2024-10-09 ENCOUNTER — Ambulatory Visit: Payer: Self-pay | Admitting: Medical

## 2024-10-09 MED ORDER — ATORVASTATIN CALCIUM 20 MG PO TABS: 20.0000 mg | ORAL_TABLET | Freq: Every day | ORAL | 3 refills | Status: AC

## 2024-10-09 NOTE — Addendum Note (Signed)
 Addended by: DORINA DALLAS HERO on: 10/09/2024 05:49 PM   Modules accepted: Orders
